# Patient Record
Sex: Female | Born: 1985 | Race: Black or African American | Hispanic: No | Marital: Single | Smoking: Current some day smoker
Health system: Southern US, Community
[De-identification: ages and names within clinical notes are randomized; demographics above are authoritative.]

## PROBLEM LIST (undated history)

## (undated) DIAGNOSIS — K859 Acute pancreatitis without necrosis or infection, unspecified: Secondary | ICD-10-CM

## (undated) DIAGNOSIS — R9431 Abnormal electrocardiogram [ECG] [EKG]: Secondary | ICD-10-CM

## (undated) DIAGNOSIS — G43909 Migraine, unspecified, not intractable, without status migrainosus: Secondary | ICD-10-CM

## (undated) DIAGNOSIS — K259 Gastric ulcer, unspecified as acute or chronic, without hemorrhage or perforation: Secondary | ICD-10-CM

## (undated) DIAGNOSIS — F419 Anxiety disorder, unspecified: Secondary | ICD-10-CM

## (undated) HISTORY — DX: Abnormal electrocardiogram (ECG) (EKG): R94.31

---
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
## 2005-03-27 ENCOUNTER — Emergency Department (HOSPITAL_COMMUNITY): Admission: EM | Admit: 2005-03-27 | Discharge: 2005-03-27 | Payer: Self-pay | Admitting: Emergency Medicine

## 2005-04-21 ENCOUNTER — Inpatient Hospital Stay (HOSPITAL_COMMUNITY): Admission: AD | Admit: 2005-04-21 | Discharge: 2005-04-21 | Payer: Self-pay | Admitting: Family Medicine

## 2006-06-09 ENCOUNTER — Emergency Department (HOSPITAL_COMMUNITY): Admission: EM | Admit: 2006-06-09 | Discharge: 2006-06-09 | Payer: Self-pay | Admitting: Emergency Medicine

## 2007-01-31 ENCOUNTER — Inpatient Hospital Stay (HOSPITAL_COMMUNITY): Admission: EM | Admit: 2007-01-31 | Discharge: 2007-02-05 | Payer: Self-pay | Admitting: Emergency Medicine

## 2007-06-07 ENCOUNTER — Emergency Department (HOSPITAL_COMMUNITY): Admission: EM | Admit: 2007-06-07 | Discharge: 2007-06-08 | Payer: Self-pay | Admitting: Emergency Medicine

## 2009-02-13 ENCOUNTER — Emergency Department (HOSPITAL_COMMUNITY): Admission: EM | Admit: 2009-02-13 | Discharge: 2009-02-13 | Payer: Self-pay | Admitting: Emergency Medicine

## 2009-03-20 ENCOUNTER — Ambulatory Visit: Payer: Self-pay | Admitting: Pulmonary Disease

## 2009-03-20 ENCOUNTER — Inpatient Hospital Stay (HOSPITAL_COMMUNITY): Admission: EM | Admit: 2009-03-20 | Discharge: 2009-03-22 | Payer: Self-pay | Admitting: Emergency Medicine

## 2009-03-28 ENCOUNTER — Ambulatory Visit: Payer: Self-pay | Admitting: Internal Medicine

## 2009-04-04 ENCOUNTER — Ambulatory Visit (HOSPITAL_COMMUNITY): Payer: Self-pay | Admitting: Psychiatry

## 2009-04-09 ENCOUNTER — Ambulatory Visit (HOSPITAL_COMMUNITY): Payer: Self-pay | Admitting: Psychiatry

## 2010-01-16 ENCOUNTER — Emergency Department (HOSPITAL_COMMUNITY): Admission: EM | Admit: 2010-01-16 | Discharge: 2010-01-16 | Payer: Self-pay | Admitting: Emergency Medicine

## 2010-03-18 ENCOUNTER — Ambulatory Visit (HOSPITAL_COMMUNITY)
Admission: RE | Admit: 2010-03-18 | Discharge: 2010-03-18 | Payer: Self-pay | Source: Home / Self Care | Attending: Obstetrics and Gynecology | Admitting: Obstetrics and Gynecology

## 2010-03-31 ENCOUNTER — Encounter: Admit: 2010-03-31 | Payer: Self-pay | Admitting: Obstetrics and Gynecology

## 2010-04-18 ENCOUNTER — Inpatient Hospital Stay (HOSPITAL_COMMUNITY)
Admission: AD | Admit: 2010-04-18 | Discharge: 2010-04-18 | Disposition: A | Discharge status: Home / Self Care | Payer: Medicaid Other | Source: Ambulatory Visit | Attending: Obstetrics and Gynecology | Admitting: Obstetrics and Gynecology

## 2010-04-18 DIAGNOSIS — O368190 Decreased fetal movements, unspecified trimester, not applicable or unspecified: Secondary | ICD-10-CM | POA: Insufficient documentation

## 2010-05-15 ENCOUNTER — Inpatient Hospital Stay (HOSPITAL_COMMUNITY)
Admission: AD | Admit: 2010-05-15 | Discharge: 2010-05-15 | Disposition: A | Discharge status: Home / Self Care | Payer: Medicaid Other | Source: Ambulatory Visit | Attending: Obstetrics & Gynecology | Admitting: Obstetrics & Gynecology

## 2010-05-15 DIAGNOSIS — J069 Acute upper respiratory infection, unspecified: Secondary | ICD-10-CM | POA: Insufficient documentation

## 2010-05-15 DIAGNOSIS — R109 Unspecified abdominal pain: Secondary | ICD-10-CM | POA: Insufficient documentation

## 2010-05-15 DIAGNOSIS — O99891 Other specified diseases and conditions complicating pregnancy: Secondary | ICD-10-CM | POA: Insufficient documentation

## 2010-05-15 LAB — URINALYSIS, ROUTINE W REFLEX MICROSCOPIC
Hgb urine dipstick: NEGATIVE
Ketones, ur: >80 mg/dL — AB
Nitrite: NEGATIVE
Protein, ur: NEGATIVE mg/dL
Specific Gravity, Urine: 1.02 (ref 1.005–1.030)
Urine Glucose, Fasting: NEGATIVE mg/dL
Urobilinogen, UA: 1 mg/dL (ref 0.0–1.0)
pH: 6 (ref 5.0–8.0)

## 2010-05-27 LAB — URINALYSIS, ROUTINE W REFLEX MICROSCOPIC
Bilirubin Urine: NEGATIVE
Glucose, UA: NEGATIVE mg/dL
Hgb urine dipstick: NEGATIVE
Ketones, ur: 40 mg/dL — AB
Nitrite: NEGATIVE
Protein, ur: NEGATIVE mg/dL
Specific Gravity, Urine: 1.018 (ref 1.005–1.030)
Urobilinogen, UA: 1 mg/dL (ref 0.0–1.0)
pH: 6.5 (ref 5.0–8.0)

## 2010-05-27 LAB — COMPREHENSIVE METABOLIC PANEL
ALT: 22 U/L (ref 0–35)
AST: 19 U/L (ref 0–37)
Albumin: 3.2 g/dL — ABNORMAL LOW (ref 3.5–5.2)
Alkaline Phosphatase: 45 U/L (ref 39–117)
BUN: 6 mg/dL (ref 6–23)
CO2: 23 mEq/L (ref 19–32)
Calcium: 9.4 mg/dL (ref 8.4–10.5)
Chloride: 103 mEq/L (ref 96–112)
Creatinine, Ser: 0.74 mg/dL (ref 0.4–1.2)
GFR calc Af Amer: >60 mL/min (ref >60)
GFR calc non Af Amer: >60 mL/min (ref >60)
Glucose, Bld: 85 mg/dL (ref 70–99)
Potassium: 3.8 mEq/L (ref 3.5–5.1)
Sodium: 135 mEq/L (ref 135–145)
Total Bilirubin: 0.7 mg/dL (ref 0.3–1.2)
Total Protein: 7.2 g/dL (ref 6.0–8.3)

## 2010-05-27 LAB — CBC
HCT: 32.7 % — ABNORMAL LOW (ref 36.0–46.0)
Hemoglobin: 11.1 g/dL — ABNORMAL LOW (ref 12.0–15.0)
MCH: 29.3 pg (ref 26.0–34.0)
MCHC: 34.1 g/dL (ref 30.0–36.0)
MCV: 86 fL (ref 78.0–100.0)
Platelets: 287 10*3/uL (ref 150–400)
RBC: 3.8 MIL/uL — ABNORMAL LOW (ref 3.87–5.11)
RDW: 14.6 % (ref 11.5–15.5)
WBC: 6.2 10*3/uL (ref 4.0–10.5)

## 2010-05-27 LAB — WET PREP, GENITAL
Trich, Wet Prep: NONE SEEN
Yeast Wet Prep HPF POC: NONE SEEN

## 2010-05-27 LAB — DIFFERENTIAL
Basophils Absolute: 0 10*3/uL (ref 0.0–0.1)
Basophils Relative: 1 % (ref 0–1)
Eosinophils Absolute: 0.2 10*3/uL (ref 0.0–0.7)
Eosinophils Relative: 4 % (ref 0–5)
Lymphocytes Relative: 25 % (ref 12–46)
Lymphs Abs: 1.5 10*3/uL (ref 0.7–4.0)
Monocytes Absolute: 0.6 10*3/uL (ref 0.1–1.0)
Monocytes Relative: 10 % (ref 3–12)
Neutro Abs: 3.7 10*3/uL (ref 1.7–7.7)
Neutrophils Relative %: 60 % (ref 43–77)

## 2010-05-27 LAB — PREGNANCY, URINE: Preg Test, Ur: POSITIVE

## 2010-05-27 LAB — GC/CHLAMYDIA PROBE AMP, GENITAL
Chlamydia, DNA Probe: NEGATIVE
GC Probe Amp, Genital: NEGATIVE

## 2010-05-27 LAB — LIPASE, BLOOD: Lipase: 19 U/L (ref 11–59)

## 2010-05-27 LAB — HCG, QUANTITATIVE, PREGNANCY: hCG, Beta Chain, Quant, S: 81833 m[IU]/mL — ABNORMAL HIGH (ref <5)

## 2010-06-01 LAB — CBC
HCT: 30.1 % — ABNORMAL LOW (ref 36.0–46.0)
Hemoglobin: 10 g/dL — ABNORMAL LOW (ref 12.0–15.0)
Hemoglobin: 10.1 g/dL — ABNORMAL LOW (ref 12.0–15.0)
Hemoglobin: 12.2 g/dL (ref 12.0–15.0)
MCHC: 33.6 g/dL (ref 30.0–36.0)
MCHC: 33.7 g/dL (ref 30.0–36.0)
MCHC: 34.4 g/dL (ref 30.0–36.0)
MCV: 87.8 fL (ref 78.0–100.0)
Platelets: 304 10*3/uL (ref 150–400)
RBC: 3.43 MIL/uL — ABNORMAL LOW (ref 3.87–5.11)
RDW: 14.9 % (ref 11.5–15.5)
RDW: 15.8 % — ABNORMAL HIGH (ref 11.5–15.5)

## 2010-06-01 LAB — POCT I-STAT 3, ART BLOOD GAS (G3+)
Acid-base deficit: 2 mmol/L (ref 0.0–2.0)
Bicarbonate: 22.2 mEq/L (ref 20.0–24.0)
O2 Saturation: 100 %
TCO2: 24 mmol/L (ref 0–100)
pCO2 arterial: 42.5 mmHg (ref 35.0–45.0)
pO2, Arterial: 189 mmHg — ABNORMAL HIGH (ref 80.0–100.0)
pO2, Arterial: 530 mmHg — ABNORMAL HIGH (ref 80.0–100.0)

## 2010-06-01 LAB — POCT I-STAT 3, VENOUS BLOOD GAS (G3P V)
Acid-base deficit: 2 mmol/L (ref 0.0–2.0)
Patient temperature: 37
pH, Ven: 7.404 — ABNORMAL HIGH (ref 7.250–7.300)
pO2, Ven: 54 mmHg — ABNORMAL HIGH (ref 30.0–45.0)

## 2010-06-01 LAB — DIFFERENTIAL
Basophils Absolute: 0 10*3/uL (ref 0.0–0.1)
Basophils Relative: 1 % (ref 0–1)
Lymphocytes Relative: 30 % (ref 12–46)
Monocytes Absolute: 0.6 10*3/uL (ref 0.1–1.0)
Neutro Abs: 4.4 10*3/uL (ref 1.7–7.7)

## 2010-06-01 LAB — GLUCOSE, CAPILLARY: Glucose-Capillary: 104 mg/dL — ABNORMAL HIGH (ref 70–99)

## 2010-06-01 LAB — BASIC METABOLIC PANEL
CO2: 25 mEq/L (ref 19–32)
CO2: 26 mEq/L (ref 19–32)
Calcium: 8.6 mg/dL (ref 8.4–10.5)
Chloride: 107 mEq/L (ref 96–112)
Creatinine, Ser: 0.79 mg/dL (ref 0.4–1.2)
GFR calc Af Amer: >60 mL/min (ref >60)
Glucose, Bld: 85 mg/dL (ref 70–99)
Potassium: 3.6 mEq/L (ref 3.5–5.1)
Sodium: 138 mEq/L (ref 135–145)

## 2010-06-01 LAB — POCT I-STAT, CHEM 8
Chloride: 108 mEq/L (ref 96–112)
Creatinine, Ser: 1 mg/dL (ref 0.4–1.2)
HCT: 38 % (ref 36.0–46.0)
Hemoglobin: 12.9 g/dL (ref 12.0–15.0)
Potassium: 3.2 mEq/L — ABNORMAL LOW (ref 3.5–5.1)
Sodium: 142 mEq/L (ref 135–145)

## 2010-06-01 LAB — COMPREHENSIVE METABOLIC PANEL
Albumin: 3.5 g/dL (ref 3.5–5.2)
Alkaline Phosphatase: 50 U/L (ref 39–117)
BUN: 6 mg/dL (ref 6–23)
Creatinine, Ser: 0.82 mg/dL (ref 0.4–1.2)
Potassium: 4.2 mEq/L (ref 3.5–5.1)
Total Protein: 7.1 g/dL (ref 6.0–8.3)

## 2010-06-01 LAB — URINE MICROSCOPIC-ADD ON

## 2010-06-01 LAB — URINALYSIS, ROUTINE W REFLEX MICROSCOPIC
Glucose, UA: NEGATIVE mg/dL
Specific Gravity, Urine: 1.014 (ref 1.005–1.030)
pH: 5.5 (ref 5.0–8.0)

## 2010-06-01 LAB — RAPID URINE DRUG SCREEN, HOSP PERFORMED
Barbiturates: NOT DETECTED
Opiates: NOT DETECTED

## 2010-06-01 LAB — URINE CULTURE

## 2010-06-01 LAB — ETHANOL: Alcohol, Ethyl (B): 145 mg/dL — ABNORMAL HIGH (ref 0–10)

## 2010-06-01 LAB — SALICYLATE LEVEL: Salicylate Lvl: <4 mg/dL (ref 2.8–20.0)

## 2010-07-29 NOTE — H&P (Signed)
NAMEREILLEY, Sara NO.:  [id number]   MEDICAL RECORD NO.:  [id number]          PATIENT TYPE:  EMS   LOCATION:  ED                           FACILITY:  [facility]   PHYSICIAN:  Herbie Saxon, MDDATE OF BIRTH:  February 09, 1986   DATE OF ADMISSION:  01/31/2007  DATE OF DISCHARGE:                              HISTORY & PHYSICAL   PRIMARY CARE PHYSICIAN:  Unassigned.   CHIEF COMPLAINT:  Cough for three days, shortness of breath and wheezing  for one day.   HISTORY OF PRESENT ILLNESS:  This is a 25 year old African-American  female, student of [employer] who has a past medical history of  childhood bronchial asthma.  She started coughing with greenish-yellow  phlegm about three days associated with increasing shortness of breath  overnight, with increased wheezing and dyspnea on mild exertion  associated with rib cage pain from profuse coughing.  She denies any  hemoptysis.  She has low grade fever.  No substernal chest pain.  She  denies any paroxysmal nocturnal dyspnea, no orthopnea, no pedal  swelling.  No palpitations, no headache.  She denies any diarrhea,  constipation, dysuria, or hematuria.  No joint pain or skin rash.  No  lightheadedness.  The patient has used prednisone for asthma in the  past, but never been intubated for severe asthma.   PAST MEDICAL HISTORY:  Bronchial asthma.   PAST SURGICAL HISTORY:  None of note.   FAMILY HISTORY:  Noncontributory.   SOCIAL HISTORY:  She is single and has no children.  She smokes cigars,  about five every three days.  Social drinker at parties.  No history of  drug abuse.   REVIEW OF SYSTEMS:  14 systems reviewed, pertinent positives as present  in the history of presenting illness.   PHYSICAL EXAMINATION:  GENERAL:  She is a young lady, obese, mildly  tachypneic.  VITAL SIGNS:  Temperature is 99.4, pulse 114, respiratory rate is 24.  HEENT:  Pupils equal, round, and reactive to light and  accommodation.  Head is normocephalic and atraumatic.  Mucous membranes are moist.  Oropharynx and nasopharynx are clear.  NECK:  Supple, there is no thyromegaly or carotid bruit.  No elevated  JVP.  LUNGS:  She has bilateral expiratory rhonchi.  HEART:  Sounds 1 and 2 tachycardic.  ABDOMEN:  Soft and nontender.  No organomegaly.  Bowel sounds are  normoactive.  Inguinal orifices are patent.  NEUROLOGY:  She is alert and oriented x3.  Cranial nerves II-XII grossly  intact.  Power is 5+ in all limbs tested.  Reflexes are 2+ globally.  Sensation is normal.  Peripheral pulses present, no pedal edema.   LABORATORY DATA:  Pending.   ASSESSMENT:  1. Acute on chronic bronchitis, chronic obstructive pulmonary disease,      bronchial asthma exacerbation.  2. Morbid obesity.  3. Tachycardia.   PLAN:  The patient is to be admitted to a medical bed.  IV fluid normal  saline at 40 mL an hour.  We will start on IV Solu-Medrol 80 mg q.8  hours, Zithromax 500 mg IV daily, magnesium sulfate  2 grams IV, start  Nicotine patch 20 mg per day, Xanax 0.5 mg p.o. b.i.d.  Counseled on  tobacco cessation.  Albuterol Atrovent one unit dose q.2-3 hours p.r.n.  Tylenol 650 mg q.4 hours p.r.n. for fever or mild pain.   MEDICATIONS:  Albuterol two puffs q.6 hours p.r.n.   ALLERGIES:  No known drug allergies.   DIET:  Regular.   We will review labs, chest x-ray, and EKG.      Herbie Saxon, MD  Electronically Signed     MIO/MEDQ  D:  01/31/2007  T:  01/31/2007  Job:  [id number]

## 2010-07-29 NOTE — Discharge Summary (Signed)
NAMESHANDORA, KOOGLER NO.:  [id number]   MEDICAL RECORD NO.:  [id number]          PATIENT TYPE:  INP   LOCATION:  1604                         FACILITY:  [facility]   PHYSICIAN:  Elliot Cousin, M.D.    DATE OF BIRTH:  1985/11/05   DATE OF ADMISSION:  01/31/2007  DATE OF DISCHARGE:  02/05/2007                               DISCHARGE SUMMARY   DISCHARGE DIAGNOSES:  1. Acute asthmatic bronchitis.  2. Hyperglycemia secondary to steroids.  3. Tobacco abuse.  4. Bradycardia.  5. THE PATIENT HAD AN ALLERGIC REACTION TO THE CONTRAST/ DYE.   DISCHARGE MEDICATIONS:  1. Albuterol MDI 2 puffs every 4 hours as needed.  2. Prednisone dose taper, to be tapered over the next 5 days.  3. Zithromax 250 mg daily for two more days.  4. Claritin 10 mg daily.  5. Robitussin DM 10 mL every 4 hours as needed.   DISCHARGE DISPOSITION:  The patient was discharged home in improved and  stable condition on February 05, 2007.  She was advised to follow up  with the [facility] in one to two weeks.   CONSULTATIONS:  None.   PROCEDURES PERFORMED:  1. VQ scan, February 01, 2007.  The results revealed negative VQ scan      for pulmonary embolism.  Air trapping which may be due to reactive      airways disease in this young patient.  2. CT angiogram of the chest on February 01, 2007.  The study is      suboptimal for diagnosing PE.  Unfortunately, the degree of      contrast opacification of the pulmonary arteries is suboptimal.      The patient also developed an allergic reaction to the contrast      media; therefore, additional contrast could not be administered.      No gross evidence of PE.  Consider VQ scan for further evaluation.  3. Chest x-ray on January 31, 2007.  The results revealed no acute      findings.   HISTORY OF PRESENT ILLNESS:  The patient is a 25 year old woman with  bronchial asthma, who presented to the emergency department on January 31, 2007 with a chief  complaint of shortness of breath, wheezing, chest  tightness, and coughing.  When the patient was evaluated in the  emergency department, she was noted to be afebrile with a temperature of  99.4, tachycardiac, and tachypneic.  She was given Solu-Medrol in the  emergency department and subsequently admitted for further evaluation  and management.   For additional details, please see the dictated history and physical.   HOSPITAL COURSE:  1. ACUTE ASTHMATIC BRONCHITIS.  The patient was started on albuterol      and Atrovent nebulizers every 4 hours and then every 2 hours p.r.n.      Two grams of magnesium sulfate were given.  She was started      empirically on antibiotics with Rocephin and azithromycin.  Steroid      therapy was continued with Solu-Medrol 80 mg q.8 h.  The patient  does smoke, and she was strongly admonished to stop.  She did      receive formal tobacco cessation counseling.  She was started on a      nicotine patch.  Xanax was started as needed for anxiousness.  The      patient was also given Mucinex and as-needed Robitussin DM.  For      further evaluation, a CT scan of the chest was ordered to rule out      PE.  The study was suboptimal, as the patient developed an allergic      reaction to the contrast media. She developed hives and pruritis.      She received Benadryl and additional Solu-Medrol.  Because of the      incomplete study, a VQ scan was ordered.  The VQ scan was negative      for PE. Her ABG on oxygen revealed a pH of 7.42, pCO2 of 35, and a      pO2 of 69.   Over the course of the hospitalization, the patient was started on  Claritin and then Singulair.  She developed a tingling sensation in her  legs, therefore, the Singulair was discontinued. It was unclear if the  Singulair caused her symptoms.   The patient's chest tightness subsided. Prior to hospital discharge, she  began to oxygenate 98% on room air.  The patient became less and less   symptomatic.  On the exam prior to hospital discharge, she was  completely free of bronchospasms.  The Solu-Medrol was tapered off, and  the patient was started on prednisone daily.  She will be discharged to  home on two more days of azithromycin and a 5-day prednisone dose taper.  Again, the patient was strongly admonished to stop smoking.  She was  advised to follow up with a physician at the [place] [facility].  1. HYPERGLYCEMIA.  The patient's capillary blood glucose increased      during the hospital course.  She had no prior history of diabetes      mellitus.  Sliding scale NovoLog and Lantus were administered.  Her      capillary blood glucose was well controlled.  As the prednisone is      tapered off, her serum glucose is expected to normalize.  As of      this morning, her capillary blood sugar was 102.  2. BRADYCARDIA.  The patient was initially tachycardiac at the time of      the initial hospital assessment.  However, as she improved      clinically, she did have asymptomatic bradycardia, particularly      when she was sleeping and at rest.  Her heart rate at rest ranged      from 50 to 60.  With activity, it increased to 80 to 90 beats per      minute.  3. ALLERGIC REACTION TO CONTRAST DYE.  As indicated above, the patient      developed hives and itching and mild worsening shortness of breath,      when she was given the contrast media for the CT of the chest.  She      was quickly treated with Benadryl and Solu-Medrol.  Her symptoms      quickly abated.  The patient was advised to always let the medical      establishment know that she is allergic to IV contrast.  The      patient voiced understanding.  Elliot Cousin, M.D.  Electronically Signed     DF/MEDQ  D:  02/05/2007  T:  02/06/2007  Job:  [id number]

## 2010-08-07 ENCOUNTER — Inpatient Hospital Stay (HOSPITAL_COMMUNITY): Payer: Medicaid Other

## 2010-08-07 ENCOUNTER — Encounter (HOSPITAL_COMMUNITY): Payer: Self-pay | Admitting: Radiology

## 2010-08-07 ENCOUNTER — Inpatient Hospital Stay (HOSPITAL_COMMUNITY)
Admission: AD | Admit: 2010-08-07 | Discharge: 2010-08-07 | Disposition: A | Discharge status: Home / Self Care | Payer: Medicaid Other | Source: Ambulatory Visit | Attending: Obstetrics and Gynecology | Admitting: Obstetrics and Gynecology

## 2010-08-07 DIAGNOSIS — O368190 Decreased fetal movements, unspecified trimester, not applicable or unspecified: Secondary | ICD-10-CM | POA: Insufficient documentation

## 2010-08-19 ENCOUNTER — Inpatient Hospital Stay (HOSPITAL_COMMUNITY)
Admission: AD | Admit: 2010-08-19 | Discharge: 2010-08-23 | DRG: 775 | Disposition: A | Discharge status: Home / Self Care | Payer: Medicaid Other | Source: Ambulatory Visit | Attending: Obstetrics and Gynecology | Admitting: Obstetrics and Gynecology

## 2010-08-19 ENCOUNTER — Encounter (HOSPITAL_COMMUNITY): Payer: Self-pay | Admitting: *Deleted

## 2010-08-19 DIAGNOSIS — O480 Post-term pregnancy: Principal | ICD-10-CM | POA: Diagnosis present

## 2010-08-19 LAB — CBC
MCH: 27.7 pg (ref 26.0–34.0)
Platelets: 314 10*3/uL (ref 150–400)
RBC: 3.93 MIL/uL (ref 3.87–5.11)
RDW: 15.3 % (ref 11.5–15.5)
WBC: 10.3 10*3/uL (ref 4.0–10.5)

## 2010-08-19 LAB — MRSA PCR SCREENING: MRSA by PCR: NEGATIVE

## 2010-08-20 LAB — RPR: RPR Ser Ql: NONREACTIVE

## 2010-08-22 LAB — CBC
Hemoglobin: 8.8 g/dL — ABNORMAL LOW (ref 12.0–15.0)
MCHC: 32.8 g/dL (ref 30.0–36.0)
RBC: 3.18 MIL/uL — ABNORMAL LOW (ref 3.87–5.11)

## 2010-12-08 LAB — BASIC METABOLIC PANEL
CO2: 26
Calcium: 8.9
GFR calc Af Amer: >60
GFR calc non Af Amer: >60
Sodium: 136

## 2010-12-08 LAB — DIFFERENTIAL
Lymphocytes Relative: 33
Lymphs Abs: 2.4
Monocytes Absolute: 0.6
Monocytes Relative: 9
Neutro Abs: 4.2

## 2010-12-08 LAB — CBC
Hemoglobin: 11.4 — ABNORMAL LOW
MCHC: 33.2
RBC: 4.09
WBC: 7.5

## 2010-12-23 LAB — CBC
Hemoglobin: 11.2 — ABNORMAL LOW
Hemoglobin: 11.7 — ABNORMAL LOW
MCHC: 33.4
MCHC: 34.1
RBC: 3.89
RBC: 4.14
WBC: 18.3 — ABNORMAL HIGH

## 2010-12-23 LAB — BASIC METABOLIC PANEL
BUN: 14
BUN: 8
CO2: 23
CO2: 24
Calcium: 9.2
Chloride: 105
Chloride: 106
Creatinine, Ser: 0.76
GFR calc Af Amer: >60
GFR calc non Af Amer: >60
Glucose, Bld: 120 — ABNORMAL HIGH
Glucose, Bld: 121 — ABNORMAL HIGH
Potassium: 3.4 — ABNORMAL LOW
Potassium: 4.1
Sodium: 134 — ABNORMAL LOW
Sodium: 136

## 2010-12-23 LAB — BLOOD GAS, ARTERIAL
Acid-base deficit: 0.9
FIO2: 0.28
O2 Content: 6
O2 Saturation: 94.1
Patient temperature: 98.6
pO2, Arterial: 68.9 — ABNORMAL LOW

## 2010-12-23 LAB — LIPID PANEL
Cholesterol: 115
HDL: 51
Triglycerides: 49

## 2010-12-23 LAB — CULTURE, BLOOD (ROUTINE X 2)

## 2010-12-23 LAB — MAGNESIUM: Magnesium: 1.6

## 2010-12-23 LAB — URINALYSIS, ROUTINE W REFLEX MICROSCOPIC
Glucose, UA: NEGATIVE
Nitrite: NEGATIVE
Protein, ur: NEGATIVE
pH: 5.5

## 2010-12-23 LAB — COMPREHENSIVE METABOLIC PANEL
ALT: 26
Alkaline Phosphatase: 53
CO2: 25
Calcium: 8.8
Chloride: 103
GFR calc non Af Amer: >60
Glucose, Bld: 126 — ABNORMAL HIGH
Sodium: 134 — ABNORMAL LOW
Total Bilirubin: 0.6

## 2010-12-23 LAB — CK TOTAL AND CKMB (NOT AT ARMC)
CK, MB: 1.8
CK, MB: 2.1
Relative Index: 0.6
Relative Index: 1.1
Total CK: 193 — ABNORMAL HIGH

## 2010-12-23 LAB — PROTIME-INR: INR: 1

## 2010-12-23 LAB — PHOSPHORUS: Phosphorus: 2.6

## 2010-12-23 LAB — APTT: aPTT: 31

## 2010-12-30 ENCOUNTER — Encounter (HOSPITAL_COMMUNITY): Payer: Self-pay | Admitting: *Deleted

## 2011-07-07 ENCOUNTER — Emergency Department (HOSPITAL_COMMUNITY)
Admission: EM | Admit: 2011-07-07 | Discharge: 2011-07-08 | Disposition: A | Discharge status: Home / Self Care | Payer: Medicaid Other | Attending: Emergency Medicine | Admitting: Emergency Medicine

## 2011-07-07 DIAGNOSIS — R1032 Left lower quadrant pain: Secondary | ICD-10-CM | POA: Insufficient documentation

## 2011-07-07 DIAGNOSIS — J45909 Unspecified asthma, uncomplicated: Secondary | ICD-10-CM | POA: Insufficient documentation

## 2011-07-07 DIAGNOSIS — N200 Calculus of kidney: Secondary | ICD-10-CM

## 2011-07-07 DIAGNOSIS — F17200 Nicotine dependence, unspecified, uncomplicated: Secondary | ICD-10-CM | POA: Insufficient documentation

## 2011-07-07 DIAGNOSIS — N83209 Unspecified ovarian cyst, unspecified side: Secondary | ICD-10-CM | POA: Insufficient documentation

## 2011-07-07 DIAGNOSIS — N949 Unspecified condition associated with female genital organs and menstrual cycle: Secondary | ICD-10-CM | POA: Insufficient documentation

## 2011-07-08 ENCOUNTER — Emergency Department (HOSPITAL_COMMUNITY): Payer: Medicaid Other

## 2011-07-08 ENCOUNTER — Encounter (HOSPITAL_COMMUNITY): Payer: Self-pay | Admitting: Emergency Medicine

## 2011-07-08 LAB — URINALYSIS, ROUTINE W REFLEX MICROSCOPIC
Ketones, ur: NEGATIVE mg/dL
Leukocytes, UA: NEGATIVE
Nitrite: NEGATIVE
Protein, ur: NEGATIVE mg/dL
Urobilinogen, UA: 1 mg/dL (ref 0.0–1.0)
pH: 6.5 (ref 5.0–8.0)

## 2011-07-08 LAB — POCT PREGNANCY, URINE: Preg Test, Ur: NEGATIVE

## 2011-07-08 MED ORDER — ONDANSETRON HCL 4 MG PO TABS: 4.0000 mg | ORAL_TABLET | Freq: Four times a day (QID) | ORAL | Status: AC

## 2011-07-08 MED ORDER — HYDROCODONE-ACETAMINOPHEN 5-325 MG PO TABS: 1.0000 | ORAL_TABLET | Freq: Four times a day (QID) | ORAL | Status: AC | PRN

## 2011-07-08 NOTE — ED Provider Notes (Addendum)
History     CSN: [id number]  Arrival date & time 07/07/11  2352   First MD Initiated Contact with Patient 07/08/11 [phone]      Chief Complaint  Patient presents with  . Abdominal Pain    (Consider location/radiation/quality/duration/timing/severity/associated sxs/prior treatment) HPI Comments: 4 days of left lower abdominal pain, and pressure that radiates to her rectal area.  She has a history of ovarian cysts and this is similar in nature.  She was taking over-the-counter nonsteroidals without relief.  She has not seen her primary care physician  The history is provided by the patient.    Past Medical History  Diagnosis Date  . Asthma     History reviewed. No pertinent past surgical history.  Family History  Problem Relation Age of Onset  . Hypertension Other   . Diabetes Other     History  Substance Use Topics  . Smoking status: Current Everyday Smoker    Types: Cigarettes  . Smokeless tobacco: Not on file  . Alcohol Use: Yes     occassional    OB History    Grav Para Term Preterm Abortions TAB SAB Ect Mult Living   1 1 1              Review of Systems  Constitutional: Negative for fever and chills.  Gastrointestinal: Positive for abdominal pain. Negative for nausea, diarrhea and constipation.  Genitourinary: Positive for pelvic pain. Negative for vaginal bleeding and vaginal discharge.  Skin: Negative for rash and wound.  Neurological: Negative.     Allergies  Contrast media  Home Medications  No current outpatient prescriptions on file.  BP 103/55  Pulse 70  Temp 99 F (37.2 C) (Oral)  Resp 18  SpO2 97%  LMP 06/15/2011  Physical Exam  Vitals reviewed. Constitutional: She is oriented to person, place, and time. She appears well-developed and well-nourished.  HENT:  Head: Normocephalic.  Eyes: Pupils are equal, round, and reactive to light.  Neck: Normal range of motion.  Cardiovascular: Normal rate.   Pulmonary/Chest: Effort normal.    Abdominal: Soft. She exhibits no distension. There is tenderness in the left lower quadrant.  Genitourinary: Pelvic exam was performed with patient supine. Left adnexum displays tenderness. No tenderness around the vagina. No vaginal discharge found.  Musculoskeletal: Normal range of motion.  Neurological: She is alert and oriented to person, place, and time.  Skin: Skin is warm. No rash noted.    ED Course  Procedures (including critical care time)  Labs Reviewed  WET PREP, GENITAL - Abnormal; Notable for the following:    Clue Cells Wet Prep HPF POC FEW (*)     WBC, Wet Prep HPF POC FEW (*)     All other components within normal limits  URINALYSIS, ROUTINE W REFLEX MICROSCOPIC  GC/CHLAMYDIA PROBE AMP, GENITAL  POCT PREGNANCY, URINE  LAB REPORT - SCANNED   No results found.   1. Kidney stone on left side       MDM  After pelvic examination, the patient has a left ovarian cyst, since the pain has been intermittent in nature for the past 4, days.  We'll ask for an ultrasound to rule out torsion Ultrasound of the abdomen is normal, revealing normal ovaries.  No endometrial thickening, acute abdomen was then obtained, which revealed a small left renal stone.  She will be referred to urology        Arman Filter, NP 07/08/11 4401  Arman Filter, NP 09/29/11 2129

## 2011-07-08 NOTE — ED Notes (Signed)
Pt states she is having abd pain that started about 4 days ago Pt states the pain is in her lower abdomen mainly on the left side describes as a pressure and states she also has a lot of pressure in her bottom

## 2011-07-08 NOTE — Discharge Instructions (Signed)
Kidney Stones Kidney stones (ureteral lithiasis) are solid masses that form inside your kidneys. The intense pain is caused by the stone moving through the kidney, ureter, bladder, and urethra (urinary tract). When the stone moves, the ureter starts to spasm around the stone. The stone is usually passed in the urine.  HOME CARE  Drink enough fluids to keep your pee (urine) clear or pale yellow. This helps to get the stone out.   Strain all pee through the provided strainer. Do not pee without peeing through the strainer, not even once. If you pee the stone out, catch it. The stone may be as small as a grain of salt. Take this to your doctor.   Only take medicine as told by your doctor.   Follow up with your doctor as told.   Get follow-up X-rays as told by your doctor.  GET HELP RIGHT AWAY IF:   Your pain does not get better with medicine.   You have a fever.   Your pain increases and gets worse over 18 hours.   You have new belly (abdominal) pain.   You feel faint or pass out.  MAKE SURE YOU:   Understand these instructions.   Will watch your condition.   Will get help right away if you are not doing well or get worse.  Document Released: 08/19/2007 Document Revised: 02/19/2011 Document Reviewed: 12/28/2008 Exit[employer] Patient Information 2012 [employer]. 

## 2011-07-08 NOTE — ED Notes (Signed)
Patient transported to US 

## 2011-07-08 NOTE — ED Notes (Signed)
Pt back from [facility]

## 2011-07-10 NOTE — ED Provider Notes (Signed)
Medical screening examination/treatment/procedure(s) were performed by non-physician practitioner and as supervising physician I was immediately available for consultation/collaboration. Devoria Albe, MD, FACEP   Ward Givens, MD 07/10/11 [phone]

## 2011-09-29 NOTE — ED Provider Notes (Signed)
Medical screening examination/treatment/procedure(s) were performed by non-physician practitioner and as supervising physician I was immediately available for consultation/collaboration. Aras Albarran, MD, FACEP   Zacharie Portner L Mariel Lukins, MD 09/29/11 2342 

## 2012-07-25 ENCOUNTER — Emergency Department: Payer: Self-pay | Admitting: Emergency Medicine

## 2012-07-25 LAB — URINALYSIS, COMPLETE
Bacteria: NONE SEEN
Blood: NEGATIVE
Ketone: NEGATIVE
Leukocyte Esterase: NEGATIVE
Nitrite: NEGATIVE
Ph: 6 (ref 4.5–8.0)
RBC,UR: NONE SEEN /HPF (ref 0–5)
Specific Gravity: 1.005 (ref 1.003–1.030)
Squamous Epithelial: NONE SEEN
WBC UR: NONE SEEN /HPF (ref 0–5)

## 2012-07-25 LAB — COMPREHENSIVE METABOLIC PANEL
Calcium, Total: 9.2 mg/dL (ref 8.5–10.1)
Chloride: 102 mmol/L (ref 98–107)
Co2: 28 mmol/L (ref 21–32)
EGFR (African American): >60
EGFR (Non-African Amer.): >60
Osmolality: 268 (ref 275–301)
SGOT(AST): 27 U/L (ref 15–37)
Total Protein: 8.2 g/dL (ref 6.4–8.2)

## 2012-07-25 LAB — PREGNANCY, URINE: Pregnancy Test, Urine: NEGATIVE m[IU]/mL

## 2012-07-25 LAB — CBC
HGB: 12.3 g/dL (ref 12.0–16.0)
MCH: 28.5 pg (ref 26.0–34.0)
Platelet: 320 10*3/uL (ref 150–440)
WBC: 8.6 10*3/uL (ref 3.6–11.0)

## 2012-10-09 ENCOUNTER — Emergency Department (HOSPITAL_COMMUNITY): Payer: 59

## 2012-10-09 ENCOUNTER — Emergency Department (HOSPITAL_COMMUNITY)
Admission: EM | Admit: 2012-10-09 | Discharge: 2012-10-09 | Disposition: A | Discharge status: Home / Self Care | Payer: 59 | Attending: Emergency Medicine | Admitting: Emergency Medicine

## 2012-10-09 ENCOUNTER — Encounter (HOSPITAL_COMMUNITY): Payer: Self-pay | Admitting: Emergency Medicine

## 2012-10-09 DIAGNOSIS — F17200 Nicotine dependence, unspecified, uncomplicated: Secondary | ICD-10-CM | POA: Insufficient documentation

## 2012-10-09 DIAGNOSIS — S93409A Sprain of unspecified ligament of unspecified ankle, initial encounter: Secondary | ICD-10-CM | POA: Insufficient documentation

## 2012-10-09 DIAGNOSIS — Y929 Unspecified place or not applicable: Secondary | ICD-10-CM | POA: Insufficient documentation

## 2012-10-09 DIAGNOSIS — J45909 Unspecified asthma, uncomplicated: Secondary | ICD-10-CM | POA: Insufficient documentation

## 2012-10-09 DIAGNOSIS — W108XXA Fall (on) (from) other stairs and steps, initial encounter: Secondary | ICD-10-CM | POA: Insufficient documentation

## 2012-10-09 DIAGNOSIS — Y9389 Activity, other specified: Secondary | ICD-10-CM | POA: Insufficient documentation

## 2012-10-09 DIAGNOSIS — S93401A Sprain of unspecified ligament of right ankle, initial encounter: Secondary | ICD-10-CM

## 2012-10-09 DIAGNOSIS — X500XXA Overexertion from strenuous movement or load, initial encounter: Secondary | ICD-10-CM | POA: Insufficient documentation

## 2012-10-09 MED ORDER — NAPROXEN 500 MG PO TABS: 500.0000 mg | ORAL_TABLET | Freq: Two times a day (BID) | ORAL | Status: DC

## 2012-10-09 MED ORDER — HYDROCODONE-ACETAMINOPHEN 5-325 MG PO TABS: 1.0000 | ORAL_TABLET | Freq: Four times a day (QID) | ORAL | Status: DC | PRN

## 2012-10-09 NOTE — ED Provider Notes (Signed)
CSN: [id number]     Arrival date & time 10/09/12  4098 History     First MD Initiated Contact with Patient 10/09/12 [phone]     Chief Complaint  Patient presents with  . Fall  . Ankle Pain   (Consider location/radiation/quality/duration/timing/severity/associated sxs/prior Treatment) HPI Comments: Patient presents with pain of her right ankle.  She reports that approximately three hours prior to arrival she tripped going down the stairs and twisted her ankle.  She is currently having pain over the lateral malleolus of the right ankle.  She denies any other pain.  She has not taken anything for pain prior to arrival.  She has also noticed some swelling of the ankle.  She has been able to bear some weight, but reports significant pain with weight bearing.  Pain also worse with ROM of the ankle.  She denies numbness or tingling.  Denies prior injury to the ankle.    Patient is a 27 y.o. female presenting with ankle pain. The history is provided by the patient.  Ankle Pain   Past Medical History  Diagnosis Date  . Asthma    History reviewed. No pertinent past surgical history. Family History  Problem Relation Age of Onset  . Hypertension Other   . Diabetes Other    History  Substance Use Topics  . Smoking status: Current Every Day Smoker    Types: Cigarettes  . Smokeless tobacco: Not on file  . Alcohol Use: Yes     Comment: occassional   OB History   Grav Para Term Preterm Abortions TAB SAB Ect Mult Living   1 1 1             Review of Systems  Musculoskeletal:       Right ankle pain  All other systems reviewed and are negative.    Allergies  Contrast media  Home Medications  No current outpatient prescriptions on file. BP 124/60  Temp(Src) 99 F (37.2 C) (Oral)  Resp 20  SpO2 99%  LMP 09/25/2012 Physical Exam  Nursing note and vitals reviewed. Constitutional: She appears well-developed and well-nourished.  HENT:  Head: Normocephalic and atraumatic.  Neck:  Normal range of motion. Neck supple.  Cardiovascular: Normal rate, regular rhythm and normal heart sounds.   Pulses:      Dorsalis pedis pulses are 2+ on the right side, and 2+ on the left side.  Pulmonary/Chest: Effort normal and breath sounds normal.  Musculoskeletal:       Right knee: She exhibits normal range of motion and no swelling. No tenderness found.       Right ankle: She exhibits decreased range of motion and swelling. She exhibits no deformity, no laceration and normal pulse. Tenderness. Lateral malleolus tenderness found.  Neurological: She is alert. No sensory deficit.  Skin: Skin is warm and dry.  Psychiatric: She has a normal mood and affect.    ED Course   Procedures (including critical care time)  Labs Reviewed - No data to display Dg Ankle Complete Right  10/09/2012   *RADIOLOGY REPORT*  Clinical Data: Fall  RIGHT ANKLE - COMPLETE 3+ VIEW  Comparison: None.  Findings: No acute fracture and no dislocation.  IMPRESSION: No acute bony pathology.   Original Report Authenticated By: Jolaine Click, M.D.   No diagnosis found.  MDM  Patient presenting with right ankle pain after twisting it this morning.  Xray negative.  Neurovascularly intact.  Patient given Ankle ASO and crutches.  Patient stable for discharge.  Herbert Seta  Anne Shutter, PA-C 10/09/12 [phone]

## 2012-10-09 NOTE — ED Notes (Signed)
ASO  Applied to R ankle, pt given and instructed on use of crutches.  Discharge instructions explained to pt and a copy of instructions were supplied to pt; pt verbalized understanding of instructions.

## 2012-10-09 NOTE — ED Notes (Signed)
Pt tripped on the steps at 0600 this am and has swelling and pain to rt ankle. No loc, alert x4,

## 2012-10-12 NOTE — ED Provider Notes (Signed)
Medical screening examination/treatment/procedure(s) were performed by non-physician practitioner and as supervising physician I was immediately available for consultation/collaboration.  Candyce Churn, MD 10/12/12 1215

## 2013-05-06 ENCOUNTER — Other Ambulatory Visit (HOSPITAL_COMMUNITY)
Admission: RE | Admit: 2013-05-06 | Discharge: 2013-05-06 | Disposition: A | Discharge status: Home / Self Care | Payer: BC Managed Care – PPO | Source: Ambulatory Visit | Attending: Emergency Medicine | Admitting: Emergency Medicine

## 2013-05-06 ENCOUNTER — Encounter (HOSPITAL_COMMUNITY): Payer: Self-pay | Admitting: Emergency Medicine

## 2013-05-06 ENCOUNTER — Emergency Department (HOSPITAL_COMMUNITY)
Admission: EM | Admit: 2013-05-06 | Discharge: 2013-05-06 | Disposition: A | Discharge status: Home / Self Care | Payer: BC Managed Care – PPO | Source: Home / Self Care | Attending: Emergency Medicine | Admitting: Emergency Medicine

## 2013-05-06 DIAGNOSIS — Z113 Encounter for screening for infections with a predominantly sexual mode of transmission: Secondary | ICD-10-CM | POA: Insufficient documentation

## 2013-05-06 DIAGNOSIS — N760 Acute vaginitis: Secondary | ICD-10-CM

## 2013-05-06 HISTORY — DX: Morbid (severe) obesity due to excess calories: E66.01

## 2013-05-06 HISTORY — DX: Gastric ulcer, unspecified as acute or chronic, without hemorrhage or perforation: K25.9

## 2013-05-06 LAB — POCT URINALYSIS DIP (DEVICE)
Bilirubin Urine: NEGATIVE
Glucose, UA: NEGATIVE mg/dL
HGB URINE DIPSTICK: NEGATIVE
Ketones, ur: NEGATIVE mg/dL
NITRITE: NEGATIVE
PH: 7 (ref 5.0–8.0)
PROTEIN: NEGATIVE mg/dL
Specific Gravity, Urine: 1.02 (ref 1.005–1.030)
UROBILINOGEN UA: 0.2 mg/dL (ref 0.0–1.0)

## 2013-05-06 MED ORDER — FLUCONAZOLE 150 MG PO TABS: 150.0000 mg | ORAL_TABLET | Freq: Once | ORAL | Status: DC

## 2013-05-06 NOTE — ED Provider Notes (Signed)
CSN: [id number]     Arrival date & time 05/06/13  0913 History   First MD Initiated Contact with Patient 05/06/13 [phone]     Chief Complaint  Patient presents with  . Vaginitis     (Consider location/radiation/quality/duration/timing/severity/associated sxs/prior Treatment) HPI Comments: 28 year old female presents complaining of vaginal irritation and discharge. This started 2 days ago. She recently finished taking Pylera in preparation for bariatric surgery and she believes this may have caused a yeast infection. She has had a yeast infection in the past with identical symptoms. She denies any risk of STDs, she is sexually active with women only. She denies any abdominal pain, dysuria.   Past Medical History  Diagnosis Date  . Asthma   . Morbid obesity   . Gastric ulcer    History reviewed. No pertinent past surgical history. Family History  Problem Relation Age of Onset  . Hypertension Other   . Diabetes Other    History  Substance Use Topics  . Smoking status: Current Every Day Smoker    Types: Cigarettes  . Smokeless tobacco: Not on file     Comment: In process of quitting smoking; smokes 2 cigarettes per day  . Alcohol Use: Yes     Comment: occassional   OB History   Grav Para Term Preterm Abortions TAB SAB Ect Mult Living   1 1 1             Review of Systems  Constitutional: Negative for fever and chills.  Eyes: Negative for visual disturbance.  Respiratory: Negative for cough and shortness of breath.   Cardiovascular: Negative for chest pain, palpitations and leg swelling.  Gastrointestinal: Negative for nausea, vomiting and abdominal pain.  Endocrine: Negative for polydipsia and polyuria.  Genitourinary: Positive for vaginal discharge (and irritation). Negative for dysuria, urgency, frequency, flank pain and genital sores.  Musculoskeletal: Negative for arthralgias and myalgias.  Skin: Negative for rash.  Neurological: Negative for dizziness, weakness and  light-headedness.      Allergies  Contrast media  Home Medications   Current Outpatient Rx  Name  Route  Sig  Dispense  Refill  . bismuth-metronidazole-tetracycline (PYLERA) 140-125-125 MG per capsule   Oral   Take 3 capsules by mouth 4 (four) times daily -  before meals and at bedtime.         . fluconazole (DIFLUCAN) 150 MG tablet   Oral   Take 1 tablet (150 mg total) by mouth once. Pick up the refill and and take second dose in 5 days if symptoms have not resolved   1 tablet   1   . HYDROcodone-acetaminophen (NORCO/VICODIN) 5-325 MG per tablet   Oral   Take 1-2 tablets by mouth every 6 (six) hours as needed for pain.   10 tablet   0   . naproxen (NAPROSYN) 500 MG tablet   Oral   Take 1 tablet (500 mg total) by mouth 2 (two) times daily.   30 tablet   0    BP 126/85  Pulse 60  Temp(Src) 99 F (37.2 C) (Oral)  Resp 16  SpO2 99%  LMP 04/17/2013 Physical Exam  Nursing note and vitals reviewed. Constitutional: She is oriented to person, place, and time. Vital signs are normal. She appears well-developed and well-nourished. No distress.  Morbid obesity  HENT:  Head: Normocephalic and atraumatic.  Pulmonary/Chest: Effort normal. No respiratory distress.  Abdominal: Soft. She exhibits no distension.  Genitourinary: There is no lesion on the right labia. There is no  lesion on the left labia. Cervix exhibits no discharge. No erythema, tenderness or bleeding around the vagina. Vaginal discharge (clumpy white) found.  Neurological: She is alert and oriented to person, place, and time. She has normal strength. Coordination normal.  Skin: Skin is warm and dry. No rash noted. She is not diaphoretic.  Psychiatric: She has a normal mood and affect. Judgment normal.    ED Course  Procedures (including critical care time) Labs Review Labs Reviewed  POCT URINALYSIS DIP (DEVICE) - Abnormal; Notable for the following:    Leukocytes, UA SMALL (*)    All other components  within normal limits  CERVICOVAGINAL ANCILLARY ONLY   Imaging Review No results found.    MDM   Final diagnoses:  Vaginitis    This exam is consistent with vulvovaginal candidiasis. Will treat with Diflucan empirically while awaiting test results. Followup when necessary   Meds ordered this encounter  Medications  . fluconazole (DIFLUCAN) 150 MG tablet    Sig: Take 1 tablet (150 mg total) by mouth once. Pick up the refill and and take second dose in 5 days if symptoms have not resolved    Dispense:  1 tablet    Refill:  1    Order Specific Question:  Supervising Provider    Answer:  Lorenz CoasterKELLER, DAVID C [[id number]]       Graylon GoodZachary H Quenisha Lovins, PA-C 05/06/13 1019

## 2013-05-06 NOTE — Discharge Instructions (Signed)
Vaginitis Vaginitis is an inflammation of the vagina. It is most often caused by a change in the normal balance of the bacteria and yeast that live in the vagina. This change in balance causes an overgrowth of certain bacteria or yeast, which causes the inflammation. There are different types of vaginitis, but the most common types are:  Bacterial vaginosis.  Yeast infection (candidiasis).  Trichomoniasis vaginitis. This is a sexually transmitted infection (STI).  Viral vaginitis.  Atropic vaginitis.  Allergic vaginitis. CAUSES  The cause depends on the type of vaginitis. Vaginitis can be caused by:  Bacteria (bacterial vaginosis).  Yeast (yeast infection).  A parasite (trichomoniasis vaginitis)  A virus (viral vaginitis).  Low hormone levels (atrophic vaginitis). Low hormone levels can occur during pregnancy, breastfeeding, or after menopause.  Irritants, such as bubble baths, scented tampons, and feminine sprays (allergic vaginitis). Other factors can change the normal balance of the yeast and bacteria that live in the vagina. These include:  Antibiotic medicines.  Poor hygiene.  Diaphragms, vaginal sponges, spermicides, birth control pills, and intrauterine devices (IUD).  Sexual intercourse.  Infection.  Uncontrolled diabetes.  A weakened immune system. SYMPTOMS  Symptoms can vary depending on the cause of the vaginitis. Common symptoms include:  Abnormal vaginal discharge.  The discharge is white, gray, or yellow with bacterial vaginosis.  The discharge is thick, white, and cheesy with a yeast infection.  The discharge is frothy and yellow or greenish with trichomoniasis.  A bad vaginal odor.  The odor is fishy with bacterial vaginosis.  Vaginal itching, pain, or swelling.  Painful intercourse.  Pain or burning when urinating. Sometimes, there are no symptoms. TREATMENT  Treatment will vary depending on the type of infection.   Bacterial  vaginosis and trichomoniasis are often treated with antibiotic creams or pills.  Yeast infections are often treated with antifungal medicines, such as vaginal creams or suppositories.  Viral vaginitis has no cure, but symptoms can be treated with medicines that relieve discomfort. Your sexual partner should be treated as well.  Atrophic vaginitis may be treated with an estrogen cream, pill, suppository, or vaginal ring. If vaginal dryness occurs, lubricants and moisturizing creams may help. You may be told to avoid scented soaps, sprays, or douches.  Allergic vaginitis treatment involves quitting the use of the product that is causing the problem. Vaginal creams can be used to treat the symptoms. HOME CARE INSTRUCTIONS   Take all medicines as directed by your caregiver.  Keep your genital area clean and dry. Avoid soap and only rinse the area with water.  Avoid douching. It can remove the healthy bacteria in the vagina.  Do not use tampons or have sexual intercourse until your vaginitis has been treated. Use sanitary pads while you have vaginitis.  Wipe from front to back. This avoids the spread of bacteria from the rectum to the vagina.  Let air reach your genital area.  Wear cotton underwear to decrease moisture buildup.  Avoid wearing underwear while you sleep until your vaginitis is gone.  Avoid tight pants and underwear or nylons without a cotton panel.  Take off wet clothing (especially bathing suits) as soon as possible.  Use mild, non-scented products. Avoid using irritants, such as:  Scented feminine sprays.  Fabric softeners.  Scented detergents.  Scented tampons.  Scented soaps or bubble baths.  Practice safe sex and use condoms. Condoms may prevent the spread of trichomoniasis and viral vaginitis. SEEK MEDICAL CARE IF:   You have abdominal pain.  You   have a fever or persistent symptoms for more than 2 3 days.  You have a fever and your symptoms suddenly  get worse. Document Released: 12/28/2006 Document Revised: 11/25/2011 Document Reviewed: 08/13/2011 Exit[employer] Patient Information 2014 [employer].  

## 2013-05-06 NOTE — ED Notes (Signed)
C/O vaginal itching x 2 days with progressive worsening.  Is finishing Pylera today in prep for bariatric surgery soon.  Has tried Monistat-1 and anti-itch cream without any relief.  Pt is sexually active homosexual.

## 2013-05-07 NOTE — ED Provider Notes (Signed)
Medical screening examination/treatment/procedure(s) were performed by non-physician practitioner and as supervising physician I was immediately available for consultation/collaboration.  Leslee Homeavid Trevonn Hallum, M.D.  Reuben Likesavid C Hanan Moen, MD 05/07/13 [phone]

## 2013-05-08 LAB — CERVICOVAGINAL ANCILLARY ONLY
Chlamydia: NEGATIVE
Neisseria Gonorrhea: NEGATIVE
WET PREP (BD AFFIRM): NEGATIVE
Wet Prep (BD Affirm): NEGATIVE
Wet Prep (BD Affirm): NEGATIVE

## 2013-09-01 ENCOUNTER — Encounter: Payer: Self-pay | Admitting: Cardiovascular Disease

## 2013-09-01 ENCOUNTER — Ambulatory Visit (INDEPENDENT_AMBULATORY_CARE_PROVIDER_SITE_OTHER): Payer: BC Managed Care – PPO | Admitting: Cardiovascular Disease

## 2013-09-01 DIAGNOSIS — R9431 Abnormal electrocardiogram [ECG] [EKG]: Secondary | ICD-10-CM

## 2013-09-01 NOTE — Assessment & Plan Note (Signed)
Anticipating bariatric surgery

## 2013-09-01 NOTE — Assessment & Plan Note (Signed)
Patient was sent for preoperative cardiac clearance prior to bariatric surgery because of an abnormal EKG which was not provided. Her EKG today in the office was completely normal. Given her young age lack of risk factors she'll be cleared at low-risk for her upcoming gastric sleeve /bariatric surgery.

## 2013-09-01 NOTE — Progress Notes (Signed)
09/01/2013 Sara Contreras   09/02/1985  [id number]  Primary Physician Sara FickAMACHANDRAN,AJITH, MD Primary Cardiologist: Runell GessJonathan J. Berry MD Sara RenoFACP,FACC,FAHA, FSCAI   HPI:  [name] is a playful 28 year old morbidly overweight single African American female referred for cardiovascular clearance prior to elective bariatric surgery. She works at [employer]. Her primary care physician is Dr. Nicholos Contreras. Her cardiac risk factor profile is minimal and notable for only occasional tobacco abuse. She denies chest pain or shortness of breath. She has never had a heart attack or stroke. Village he had an abnormal EKG we'll limping evaluated for her upcoming surgery. Today in the office her EKG is completely normal. She is asymptomatic and specifically denies chest pain or shortness of breath.   Current Outpatient Prescriptions  Medication Sig Dispense Refill  . bismuth-metronidazole-tetracycline (PYLERA) 140-125-125 MG per capsule Take 3 capsules by mouth 4 (four) times daily -  before meals and at bedtime.      . fluconazole (DIFLUCAN) 150 MG tablet Take 1 tablet (150 mg total) by mouth once. Pick up the refill and and take second dose in 5 days if symptoms have not resolved  1 tablet  1  . HYDROcodone-acetaminophen (NORCO/VICODIN) 5-325 MG per tablet Take 1-2 tablets by mouth every 6 (six) hours as needed for pain.  10 tablet  0  . naproxen (NAPROSYN) 500 MG tablet Take 1 tablet (500 mg total) by mouth 2 (two) times daily.  30 tablet  0   No current facility-administered medications for this visit.    Allergies  Allergen Reactions  . Contrast Media [Iodinated Diagnostic Agents] Hives  . Methylene Blue     History   Social History  . Marital Status: Single    Spouse Name: N/A    Number of Children: N/A  . Years of Education: N/A   Occupational History  . Not on file.   Social History Main Topics  . Smoking status: Current Some Day Smoker    Types: Cigarettes  . Smokeless tobacco:  Not on file     Comment: In process of quitting smoking; smokes 2 cigarettes per day  . Alcohol Use: Yes     Comment: occassional  . Drug Use: No  . Sexual Activity: Yes     Comment: Pt is homosexual   Other Topics Concern  . Not on file   Social History Narrative  . No narrative on file     Review of Systems: General: negative for chills, fever, night sweats or weight changes.  Cardiovascular: negative for chest pain, dyspnea on exertion, edema, orthopnea, palpitations, paroxysmal nocturnal dyspnea or shortness of breath Dermatological: negative for rash Respiratory: negative for cough or wheezing Urologic: negative for hematuria Abdominal: negative for nausea, vomiting, diarrhea, bright red blood per rectum, melena, or hematemesis Neurologic: negative for visual changes, syncope, or dizziness All other systems reviewed and are otherwise negative except as noted above.    Blood pressure 124/78, pulse 83, height 5' 7.5" (1.715 m), weight 380 lb (172.367 kg).  General appearance: alert and no distress Neck: no adenopathy, no carotid bruit, no JVD, supple, symmetrical, trachea midline and thyroid not enlarged, symmetric, no tenderness/mass/nodules Lungs: clear to auscultation bilaterally Heart: regular rate and rhythm, S1, S2 normal, no murmur, click, rub or gallop Extremities: extremities normal, atraumatic, no cyanosis or edema  EKG normal sinus rhythm at 83 with no ST or T wave changes  ASSESSMENT AND PLAN:   Morbid obesity Anticipating bariatric surgery  Abnormal EKG Patient was sent  for preoperative cardiac clearance prior to bariatric surgery because of an abnormal EKG which was not provided. Her EKG today in the office was completely normal. Given her young age lack of risk factors she'll be cleared at low-risk for her upcoming gastric sleeve /bariatric surgery.      Runell GessJonathan J. Berry MD FACP,FACC,FAHA, [facility] 09/01/2013 4:03 PM

## 2013-09-01 NOTE — Patient Instructions (Signed)
Your physician recommends that you schedule a follow-up appointment in: As Needed  Your have been cleared for Surgery by your physician

## 2014-01-06 ENCOUNTER — Emergency Department (HOSPITAL_COMMUNITY)
Admission: EM | Admit: 2014-01-06 | Discharge: 2014-01-06 | Disposition: A | Discharge status: Home / Self Care | Payer: BC Managed Care – PPO | Attending: Emergency Medicine | Admitting: Emergency Medicine

## 2014-01-06 ENCOUNTER — Encounter (HOSPITAL_COMMUNITY): Payer: Self-pay | Admitting: Emergency Medicine

## 2014-01-06 ENCOUNTER — Emergency Department (HOSPITAL_COMMUNITY): Payer: BC Managed Care – PPO

## 2014-01-06 DIAGNOSIS — J45901 Unspecified asthma with (acute) exacerbation: Secondary | ICD-10-CM | POA: Insufficient documentation

## 2014-01-06 DIAGNOSIS — Z720 Tobacco use: Secondary | ICD-10-CM | POA: Diagnosis not present

## 2014-01-06 DIAGNOSIS — R0602 Shortness of breath: Secondary | ICD-10-CM

## 2014-01-06 DIAGNOSIS — Z792 Long term (current) use of antibiotics: Secondary | ICD-10-CM | POA: Diagnosis not present

## 2014-01-06 DIAGNOSIS — J189 Pneumonia, unspecified organism: Secondary | ICD-10-CM

## 2014-01-06 DIAGNOSIS — R202 Paresthesia of skin: Secondary | ICD-10-CM | POA: Diagnosis not present

## 2014-01-06 DIAGNOSIS — J159 Unspecified bacterial pneumonia: Secondary | ICD-10-CM | POA: Diagnosis not present

## 2014-01-06 DIAGNOSIS — Z79899 Other long term (current) drug therapy: Secondary | ICD-10-CM | POA: Insufficient documentation

## 2014-01-06 DIAGNOSIS — Z8719 Personal history of other diseases of the digestive system: Secondary | ICD-10-CM | POA: Diagnosis not present

## 2014-01-06 LAB — CBC WITH DIFFERENTIAL/PLATELET
Basophils Absolute: 0 10*3/uL (ref 0.0–0.1)
Basophils Relative: 0 % (ref 0–1)
EOS PCT: 1 % (ref 0–5)
Eosinophils Absolute: 0.1 10*3/uL (ref 0.0–0.7)
HCT: 34 % — ABNORMAL LOW (ref 36.0–46.0)
Hemoglobin: 11.4 g/dL — ABNORMAL LOW (ref 12.0–15.0)
LYMPHS PCT: 13 % (ref 12–46)
Lymphs Abs: 1.5 10*3/uL (ref 0.7–4.0)
MCH: 28.6 pg (ref 26.0–34.0)
MCHC: 33.5 g/dL (ref 30.0–36.0)
MCV: 85.2 fL (ref 78.0–100.0)
MONO ABS: 0.7 10*3/uL (ref 0.1–1.0)
MONOS PCT: 6 % (ref 3–12)
NEUTROS ABS: 9.3 10*3/uL — AB (ref 1.7–7.7)
Neutrophils Relative %: 80 % — ABNORMAL HIGH (ref 43–77)
Platelets: 296 10*3/uL (ref 150–400)
RBC: 3.99 MIL/uL (ref 3.87–5.11)
RDW: 14.2 % (ref 11.5–15.5)
WBC: 11.6 10*3/uL — ABNORMAL HIGH (ref 4.0–10.5)

## 2014-01-06 LAB — BASIC METABOLIC PANEL
Anion gap: 18 — ABNORMAL HIGH (ref 5–15)
BUN: 8 mg/dL (ref 6–23)
CO2: 20 mEq/L (ref 19–32)
Calcium: 9.2 mg/dL (ref 8.4–10.5)
Chloride: 100 mEq/L (ref 96–112)
Creatinine, Ser: 0.86 mg/dL (ref 0.50–1.10)
Glucose, Bld: 106 mg/dL — ABNORMAL HIGH (ref 70–99)
POTASSIUM: 3.5 meq/L — AB (ref 3.7–5.3)
Sodium: 138 mEq/L (ref 137–147)

## 2014-01-06 LAB — D-DIMER, QUANTITATIVE: D-Dimer, Quant: 0.44 ug/mL-FEU (ref 0.00–0.48)

## 2014-01-06 MED ORDER — HYDROCODONE-ACETAMINOPHEN 5-325 MG PO TABS
1.0000 | ORAL_TABLET | ORAL | Status: AC
  Administered 2014-01-06: 1 via ORAL
  Filled 2014-01-06: qty 1

## 2014-01-06 MED ORDER — AZITHROMYCIN 250 MG PO TABS: 250.0000 mg | ORAL_TABLET | Freq: Every day | ORAL | Status: DC

## 2014-01-06 MED ORDER — PREDNISONE 50 MG PO TABS: 50.0000 mg | ORAL_TABLET | Freq: Every day | ORAL | Status: DC

## 2014-01-06 MED ORDER — IPRATROPIUM-ALBUTEROL 0.5-2.5 (3) MG/3ML IN SOLN
3.0000 mL | Freq: Once | RESPIRATORY_TRACT | Status: AC
  Administered 2014-01-06: 3 mL via RESPIRATORY_TRACT

## 2014-01-06 MED ORDER — PREDNISONE 20 MG PO TABS
60.0000 mg | ORAL_TABLET | Freq: Once | ORAL | Status: AC
  Administered 2014-01-06: 60 mg via ORAL
  Filled 2014-01-06: qty 3

## 2014-01-06 MED ORDER — HYDROCODONE-ACETAMINOPHEN 5-325 MG PO TABS: 1.0000 | ORAL_TABLET | ORAL | Status: DC | PRN

## 2014-01-06 MED ORDER — ALBUTEROL SULFATE (2.5 MG/3ML) 0.083% IN NEBU
2.5000 mg | INHALATION_SOLUTION | Freq: Once | RESPIRATORY_TRACT | Status: AC
  Administered 2014-01-06: 2.5 mg via RESPIRATORY_TRACT
  Filled 2014-01-06: qty 3

## 2014-01-06 NOTE — ED Provider Notes (Signed)
CSN: [id number]     Arrival date & time 01/06/14  [id number] History   First MD Initiated Contact with Patient 01/06/14 [phone]     Chief Complaint  Patient presents with  . Shortness of Breath    Patient is a 28 y.o. female presenting with shortness of breath. The history is provided by the patient.  Shortness of Breath Severity:  Severe Onset quality:  Gradual Duration:  1 day Timing:  Constant Progression:  Worsening Associated symptoms: chest pain, cough, fever (101), vomiting and wheezing   Associated symptoms: no sore throat and no sputum production   She tried her albuterol inhaler and nebulizer treatments but it has not helped. No hx of PE.  [place].  Past Medical History  Diagnosis Date  . Asthma   . Morbid obesity   . Gastric ulcer   . Abnormal EKG    History reviewed. No pertinent past surgical history. Family History  Problem Relation Age of Onset  . Hypertension Other   . Diabetes Other    History  Substance Use Topics  . Smoking status: Current Some Day Smoker    Types: Cigarettes  . Smokeless tobacco: Not on file     Comment: In process of quitting smoking; smokes 2 cigarettes per day  . Alcohol Use: Yes     Comment: occassional   OB History   Grav Para Term Preterm Abortions TAB SAB Ect Mult Living   1 1 1             Review of Systems  Constitutional: Positive for fever (101).  HENT: Negative for sore throat.   Respiratory: Positive for cough, shortness of breath and wheezing. Negative for sputum production.   Cardiovascular: Positive for chest pain.  Gastrointestinal: Positive for vomiting.  Neurological:       She is feeling tingling in her hands and head      Allergies  Contrast media  Home Medications   Prior to Admission medications   Medication Sig Start Date End Date Taking? Authorizing Provider  albuterol (PROVENTIL HFA;VENTOLIN HFA) 108 (90 BASE) MCG/ACT inhaler Inhale into the lungs every  6 (six) hours as needed for wheezing or shortness of breath.   Yes Historical Provider, MD  albuterol (PROVENTIL) (5 MG/ML) 0.5% nebulizer solution Take 2.5 mg by nebulization every 6 (six) hours as needed for wheezing or shortness of breath.   Yes Historical Provider, MD  phentermine 37.5 MG capsule Take 37.5 mg by mouth every morning.   Yes Historical Provider, MD  azithromycin (ZITHROMAX) 250 MG tablet Take 1 tablet (250 mg total) by mouth daily. Take first 2 tablets together, then 1 every day until finished. 01/06/14   Linwood DibblesJon Gimena Buick, MD  HYDROcodone-acetaminophen (NORCO/VICODIN) 5-325 MG per tablet Take 1-2 tablets by mouth every 4 (four) hours as needed. 01/06/14   Linwood DibblesJon Devere Brem, MD  predniSONE (DELTASONE) 50 MG tablet Take 1 tablet (50 mg total) by mouth daily. 01/06/14   Linwood DibblesJon Fantasia Jinkins, MD   BP 112/61  Pulse 87  Temp(Src) 98.2 F (36.8 C) (Oral)  Resp 24  SpO2 99%  LMP 12/30/2013 Physical Exam  Nursing note and vitals reviewed. Constitutional: No distress.  Obese  HENT:  Head: Normocephalic and atraumatic.  Right Ear: External ear normal.  Left Ear: External ear normal.  Eyes: Conjunctivae are normal. Right eye exhibits no discharge. Left eye exhibits no discharge. No scleral icterus.  Neck: Neck supple. No tracheal deviation present.  Cardiovascular: Normal rate, regular rhythm and intact distal pulses.   Pulmonary/Chest: Effort normal and breath sounds normal. No stridor. No respiratory distress. She has no wheezes. She has no rales.  Abdominal: Soft. Bowel sounds are normal. She exhibits no distension. There is no tenderness. There is no rebound and no guarding.  Musculoskeletal: She exhibits no edema and no tenderness.  Neurological: She is alert. She has normal strength. No cranial nerve deficit (no facial droop, extraocular movements intact, no slurred speech) or sensory deficit. She exhibits normal muscle tone. She displays no seizure activity. Coordination normal.  Skin: Skin is warm  and dry. No rash noted.  Psychiatric: She has a normal mood and affect.    ED Course  Procedures (including critical care time) Labs Review Labs Reviewed  BASIC METABOLIC PANEL - Abnormal; Notable for the following:    Potassium 3.5 (*)    Glucose, Bld 106 (*)    Anion gap 18 (*)    All other components within normal limits  CBC WITH DIFFERENTIAL - Abnormal; Notable for the following:    WBC 11.6 (*)    Hemoglobin 11.4 (*)    HCT 34.0 (*)    Neutrophils Relative % 80 (*)    Neutro Abs 9.3 (*)    All other components within normal limits  D-DIMER, QUANTITATIVE    Imaging Review Dg Chest 2 View  01/06/2014   CLINICAL DATA:  Shortness of breath. Fever over the last day. Anterior chest pain.  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  Cardiac and mediastinal margins appear normal. No pleural effusion suspected mild airspace opacity projecting over the posterior cardiac border, possibly in the right lower lobe.  IMPRESSION: 1. Indistinct right lower lobe opacity could reflect early pneumonia. 2. 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   Electronically Signed   By: Herbie BaltimoreWalt  Liebkemann M.D.   On: 01/06/2014 08:26     EKG Interpretation None      MDM   Final diagnoses:  CAP (community acquired pneumonia)    Patient is breathing easily in the emergency department. I do not hear any wheezing. She's not in any distress. She has a normal oxygen saturation.    X-ray suggests the possibility of pneumonia. I will treat her for community-acquired pneumonia with azithromycin. She does have history of bronchospasm and I will discharge her home on a course of steroids and have her continue her albuterol treatments since she felt like she had been wheezing at home this past week.  We discussed close outpatient follow up with her primary care doctor    Linwood DibblesJon Evelette Hollern, MD 01/06/14 [phone]

## 2014-01-06 NOTE — ED Notes (Signed)
Patient currently getting a nebulizer treatment prior to being discharged.    Family sitting with the patient.

## 2014-01-06 NOTE — ED Notes (Signed)
C/o sob, chest tightness, fever, nvd (denies: cough), arrives anxious, intermitent hyperventilating, tachypneic attempting to slow her breathing, c/o hands tingling. States has used her albuterol x6 at home without relief. LS CTA, moving good air. Skin W&D

## 2014-01-06 NOTE — Discharge Instructions (Signed)
Pneumonia, Adult  Pneumonia is an infection of the lungs. It may be caused by a germ (virus or bacteria). Some types of pneumonia can spread easily from person to person. This can happen when you cough or sneeze.  HOME CARE   Only take medicine as told by your doctor.   Take your medicine (antibiotics) as told. Finish it even if you start to feel better.   Do not smoke.   You may use a vaporizer or humidifier in your room. This can help loosen thick spit (mucus).   Sleep so you are almost sitting up (semi-upright). This helps reduce coughing.   Rest.  A shot (vaccine) can help prevent pneumonia. Shots are often advised for:   People over 65 years old.   Patients on chemotherapy.   People with long-term (chronic) lung problems.   People with immune system problems.  GET HELP RIGHT AWAY IF:    You are getting worse.   You cannot control your cough, and you are losing sleep.   You cough up blood.   Your pain gets worse, even with medicine.   You have a fever.   Any of your problems are getting worse, not better.   You have shortness of breath or chest pain.  MAKE SURE YOU:    Understand these instructions.   Will watch your condition.   Will get help right away if you are not doing well or get worse.  Document Released: 08/19/2007 Document Revised: 05/25/2011 Document Reviewed: 05/23/2010  [employer] Patient Information 2015 [employer]. This information is not intended to replace advice given to you by your health care provider. Make sure you discuss any questions you have with your health care provider.

## 2014-01-15 ENCOUNTER — Encounter (HOSPITAL_COMMUNITY): Payer: Self-pay | Admitting: Emergency Medicine

## 2014-03-16 HISTORY — PX: LAPAROSCOPIC GASTRIC SLEEVE RESECTION: SHX5895

## 2015-07-27 DIAGNOSIS — J45909 Unspecified asthma, uncomplicated: Secondary | ICD-10-CM | POA: Diagnosis not present

## 2015-07-27 DIAGNOSIS — Z8719 Personal history of other diseases of the digestive system: Secondary | ICD-10-CM | POA: Insufficient documentation

## 2015-07-27 DIAGNOSIS — Z79899 Other long term (current) drug therapy: Secondary | ICD-10-CM | POA: Diagnosis not present

## 2015-07-27 DIAGNOSIS — H53149 Visual discomfort, unspecified: Secondary | ICD-10-CM | POA: Diagnosis not present

## 2015-07-27 DIAGNOSIS — F419 Anxiety disorder, unspecified: Secondary | ICD-10-CM | POA: Diagnosis not present

## 2015-07-27 DIAGNOSIS — R51 Headache: Secondary | ICD-10-CM | POA: Insufficient documentation

## 2015-07-27 DIAGNOSIS — F17210 Nicotine dependence, cigarettes, uncomplicated: Secondary | ICD-10-CM | POA: Diagnosis not present

## 2015-07-28 ENCOUNTER — Emergency Department (HOSPITAL_COMMUNITY): Payer: BLUE CROSS/BLUE SHIELD

## 2015-07-28 ENCOUNTER — Emergency Department (HOSPITAL_COMMUNITY)
Admission: EM | Admit: 2015-07-28 | Discharge: 2015-07-28 | Disposition: A | Discharge status: Home / Self Care | Payer: BLUE CROSS/BLUE SHIELD | Attending: Emergency Medicine | Admitting: Emergency Medicine

## 2015-07-28 ENCOUNTER — Encounter (HOSPITAL_COMMUNITY): Payer: Self-pay | Admitting: Emergency Medicine

## 2015-07-28 DIAGNOSIS — R519 Headache, unspecified: Secondary | ICD-10-CM

## 2015-07-28 DIAGNOSIS — R51 Headache: Secondary | ICD-10-CM

## 2015-07-28 MED ORDER — PROCHLORPERAZINE EDISYLATE 5 MG/ML IJ SOLN
10.0000 mg | Freq: Once | INTRAMUSCULAR | Status: AC
  Administered 2015-07-28: 10 mg via INTRAMUSCULAR
  Filled 2015-07-28: qty 2

## 2015-07-28 MED ORDER — KETOROLAC TROMETHAMINE 60 MG/2ML IM SOLN
60.0000 mg | Freq: Once | INTRAMUSCULAR | Status: AC
  Administered 2015-07-28: 60 mg via INTRAMUSCULAR
  Filled 2015-07-28: qty 2

## 2015-07-28 NOTE — ED Provider Notes (Signed)
CSN: [id number]     Arrival date & time 07/27/15  2340 History   First MD Initiated Contact with Patient 07/28/15 0114     No chief complaint on file.    (Consider location/radiation/quality/duration/timing/severity/associated sxs/prior Treatment) HPI ABRA LINGENFELTER is a 30 y.o. female with history of asthma, gastric ulcer, presents to emergency department complaining of a headache and anxiety. Patient states she has had headache for approximately 3 months. She states she has seen her ophthalmologist when it started and had her prescription changed. States it did not help her headache. She has also seen her primary care doctor who thought that she may have had tension headaches and told to take at the periphery and Tylenol but she has been taking. She states Tylenol and ibuprofen is not helping her headache. She denies blurred vision, denies ataxia, denies any numbness or weakness in extremities. Patient states that "my pain is all over and it feels like my brain is exploding." Patient also states that she has been watching [employer]'s [employer] today and had a panic attack. She states that the patient in the movie had a brain tumor. Patient states that her headache is not change throughout the day. It is constant. States it is 10 out of 10. She admits to photophobia and phonophobia. Denies nausea or vomiting. Denies fever. No neck pain or stiffness. No other complaints.  Past Medical History  Diagnosis Date  . Asthma   . Morbid obesity (HCC)   . Gastric ulcer   . Abnormal EKG    History reviewed. No pertinent past surgical history. Family History  Problem Relation Age of Onset  . Hypertension Other   . Diabetes Other    Social History  Substance Use Topics  . Smoking status: Current Some Day Smoker    Types: Cigarettes  . Smokeless tobacco: None     Comment: In process of quitting smoking; smokes 2 cigarettes per day  . Alcohol Use: Yes     Comment: occassional   OB History    Gravida  Para Term Preterm AB TAB SAB Ectopic Multiple Living   Review of Systems  Constitutional: Negative for fever and chills.  Respiratory: Negative for cough, chest tightness and shortness of breath.   Cardiovascular: Negative for chest pain, palpitations and leg swelling.  Gastrointestinal: Negative for nausea, vomiting, abdominal pain and diarrhea.  Genitourinary: Negative for dysuria, flank pain and pelvic pain.  Musculoskeletal: Negative for myalgias, arthralgias, neck pain and neck stiffness.  Skin: Negative for rash.  Neurological: Positive for headaches. Negative for dizziness and weakness.  Psychiatric/Behavioral: The patient is nervous/anxious.   All other systems reviewed and are negative.     Allergies  Methylene blue and Contrast media  Home Medications   Prior to Admission medications   Medication Sig Start Date End Date Taking? Authorizing Provider  acetaminophen (TYLENOL) 500 MG tablet Take 1,500 mg by mouth every 6 (six) hours as needed for moderate pain.   Yes Historical Provider, MD  albuterol (PROVENTIL HFA;VENTOLIN HFA) 108 (90 BASE) MCG/ACT inhaler Inhale into the lungs every 6 (six) hours as needed for wheezing or shortness of breath.   Yes Historical Provider, MD  albuterol (PROVENTIL) (5 MG/ML) 0.5% nebulizer solution Take 2.5 mg by nebulization every 6 (six) hours as needed for wheezing or shortness of breath.   Yes Historical Provider, MD  ibuprofen (ADVIL,MOTRIN) 200 MG tablet Take 1,600 mg by mouth  every 6 (six) hours as needed for moderate pain.   Yes Historical Provider, MD  HYDROcodone-acetaminophen (NORCO/VICODIN) 5-325 MG per tablet Take 1-2 tablets by mouth every 4 (four) hours as needed. Patient not taking: Reported on 07/28/2015 01/06/14   Linwood DibblesJon Knapp, MD  predniSONE (DELTASONE) 50 MG tablet Take 1 tablet (50 mg total) by mouth daily. Patient not taking: Reported on 07/28/2015 01/06/14   Linwood DibblesJon Knapp, MD   BP 134/100 mmHg  Pulse 125   Temp(Src) 98.2 F (36.8 C) (Oral)  Resp 18  SpO2 100% Physical Exam  Constitutional: She is oriented to person, place, and time. She appears well-developed and well-nourished. No distress.  HENT:  Head: Normocephalic.  Eyes: Conjunctivae and EOM are normal. Pupils are equal, round, and reactive to light.  Neck: Normal range of motion. Neck supple.  No meningismus  Cardiovascular: Normal rate, regular rhythm and normal heart sounds.   Pulmonary/Chest: Effort normal and breath sounds normal. No respiratory distress. She has no wheezes. She has no rales.  Abdominal: Soft. Bowel sounds are normal. She exhibits no distension. There is no tenderness. There is no rebound.  Musculoskeletal: She exhibits no edema.  Neurological: She is alert and oriented to person, place, and time. No cranial nerve deficit. Coordination normal.  5/5 and equal upper and lower extremity strength bilaterally. Equal grip strength bilaterally. Normal finger to nose and heel to shin. No pronator drift.   Skin: Skin is warm and dry.  Psychiatric: She has a normal mood and affect. Her behavior is normal.  Nursing note and vitals reviewed.   ED Course  Procedures (including critical care time) Labs Review Labs Reviewed - No data to display  Imaging Review Ct Head Wo Contrast  07/28/2015  CLINICAL DATA:  Headache for 1 month.  Panic attack. EXAM: CT HEAD WITHOUT CONTRAST TECHNIQUE: Contiguous axial images were obtained from the base of the skull through the vertex without intravenous contrast. COMPARISON:  CT HEAD March 21, 2009 FINDINGS: INTRACRANIAL CONTENTS: The ventricles and sulci are normal. No intraparenchymal hemorrhage, mass effect nor midline shift. No acute large vascular territory infarcts. No abnormal extra-axial fluid collections. Basal cisterns are patent. ORBITS: The included ocular globes and orbital contents are normal. SINUSES: Similar probable mucosal retention cysts at fronto ethmoidal recess. Ethmoid  air cells are well aerated. SKULL/SOFT TISSUES: No skull fracture. No significant soft tissue swelling. IMPRESSION: Normal CT HEAD. Electronically Signed   By: Awilda Metroourtnay  Bloomer M.D.   On: 07/28/2015 02:18   I have personally reviewed and evaluated these images and lab results as part of my medical decision-making.   EKG Interpretation None      MDM   Final diagnoses:  Nonintractable headache, unspecified chronicity pattern, unspecified headache type   Patient in emergency department with persistent headaches over the last 3 months that are worsening. She came in with a panic attack, but appears to be calm at this time. Will treat with Toradol and Compazine, will get CT of the head.  2:32 AM Patient CT scan is negative. She just received her medications. No neuro deficits on exam. She appears to be in no distress. She is afebrile. No nuchal rigidity to suggest meningitis. Gradual onset of pain over the last several months. At this time patient is stable for discharge home, she has an appointment with her primary care doctor in 4 days. She reports that she had blood work done 2 days ago and does not know results but she has not gotten any calls. Advised  to continue taking Tylenol or Motrin, advised to try Excedrin. Discussed good sleeping and eating habits. Plan to follow-up with primary care doctor scheduled and return precautions discussed.  Filed Vitals:   07/28/15 0022 07/28/15 0214  BP: 134/100 121/94  Pulse: 125 77  Temp: 98.2 F (36.8 C)   TempSrc: Oral   Resp: 18 18  SpO2: 100% 100%     Jaynie Crumble, PA-C 07/28/15 0507  Devoria Albe, MD 07/28/15 [id number]

## 2015-07-28 NOTE — ED Notes (Signed)
Patient presents for HA x1 month, denies blurred or double vision. Patient reports panic attack PTA. Patient is anxious in triage. Encouraged to work on controlling breathing and other stress relieving interventions that work at home. Rates pain 10/10.

## 2015-07-28 NOTE — Discharge Instructions (Signed)
Continue tylenol and motrin for pain. Try Exedrin. Follow up with your doctor as scheduled. Return if worsening.    General Headache Without Cause A headache is pain or discomfort felt around the head or neck area. There are many causes and types of headaches. In some cases, the cause may not be found.  HOME CARE  Managing Pain  Take over-the-counter and prescription medicines only as told by your doctor.  Lie down in a dark, quiet room when you have a headache.  If directed, apply ice to the head and neck area:  Put ice in a plastic bag.  Place a towel between your skin and the bag.  Leave the ice on for 20 minutes, 2-3 times per day.  Use a heating pad or hot shower to apply heat to the head and neck area as told by your doctor.  Keep lights dim if bright lights bother you or make your headaches worse. Eating and Drinking  Eat meals on a regular schedule.  Lessen how much alcohol you drink.  Lessen how much caffeine you drink, or stop drinking caffeine. General Instructions  Keep all follow-up visits as told by your doctor. This is important.  Keep a journal to find out if certain things bring on headaches. For example, write down:  What you eat and drink.  How much sleep you get.  Any change to your diet or medicines.  Relax by getting a massage or doing other relaxing activities.  Lessen stress.  Sit up straight. Do not tighten (tense) your muscles.  Do not use tobacco products. This includes cigarettes, chewing tobacco, or e-cigarettes. If you need help quitting, ask your doctor.  Exercise regularly as told by your doctor.  Get enough sleep. This often means 7-9 hours of sleep. GET HELP IF:  Your symptoms are not helped by medicine.  You have a headache that feels different than the other headaches.  You feel sick to your stomach (nauseous) or you throw up (vomit).  You have a fever. GET HELP RIGHT AWAY IF:   Your headache becomes really  bad.  You keep throwing up.  You have a stiff neck.  You have trouble seeing.  You have trouble speaking.  You have pain in the eye or ear.  Your muscles are weak or you lose muscle control.  You lose your balance or have trouble walking.  You feel like you will pass out (faint) or you pass out.  You have confusion.   This information is not intended to replace advice given to you by your health care provider. Make sure you discuss any questions you have with your health care provider.   Document Released: 12/10/2007 Document Revised: 11/21/2014 Document Reviewed: 06/25/2014 Elsevier Interactive Patient Education [employer]2016 Elsevier Inc.

## 2016-06-24 ENCOUNTER — Emergency Department (HOSPITAL_COMMUNITY): Payer: BLUE CROSS/BLUE SHIELD

## 2016-06-24 ENCOUNTER — Encounter (HOSPITAL_COMMUNITY): Payer: Self-pay | Admitting: *Deleted

## 2016-06-24 ENCOUNTER — Inpatient Hospital Stay (HOSPITAL_COMMUNITY)
Admission: EM | Admit: 2016-06-24 | Discharge: 2016-06-28 | DRG: 439 | Disposition: A | Discharge status: Home / Self Care | Payer: BLUE CROSS/BLUE SHIELD | Attending: Internal Medicine | Admitting: Internal Medicine

## 2016-06-24 DIAGNOSIS — Z6836 Body mass index (BMI) 36.0-36.9, adult: Secondary | ICD-10-CM

## 2016-06-24 DIAGNOSIS — R188 Other ascites: Secondary | ICD-10-CM | POA: Diagnosis present

## 2016-06-24 DIAGNOSIS — D649 Anemia, unspecified: Secondary | ICD-10-CM | POA: Diagnosis present

## 2016-06-24 DIAGNOSIS — J45909 Unspecified asthma, uncomplicated: Secondary | ICD-10-CM | POA: Diagnosis present

## 2016-06-24 DIAGNOSIS — R945 Abnormal results of liver function studies: Secondary | ICD-10-CM

## 2016-06-24 DIAGNOSIS — E669 Obesity, unspecified: Secondary | ICD-10-CM | POA: Diagnosis present

## 2016-06-24 DIAGNOSIS — R1013 Epigastric pain: Secondary | ICD-10-CM

## 2016-06-24 DIAGNOSIS — Z79899 Other long term (current) drug therapy: Secondary | ICD-10-CM | POA: Diagnosis not present

## 2016-06-24 DIAGNOSIS — Q453 Other congenital malformations of pancreas and pancreatic duct: Secondary | ICD-10-CM | POA: Diagnosis not present

## 2016-06-24 DIAGNOSIS — Z91041 Radiographic dye allergy status: Secondary | ICD-10-CM | POA: Diagnosis not present

## 2016-06-24 DIAGNOSIS — K852 Alcohol induced acute pancreatitis without necrosis or infection: Principal | ICD-10-CM | POA: Diagnosis present

## 2016-06-24 DIAGNOSIS — Z888 Allergy status to other drugs, medicaments and biological substances status: Secondary | ICD-10-CM | POA: Diagnosis not present

## 2016-06-24 DIAGNOSIS — E876 Hypokalemia: Secondary | ICD-10-CM | POA: Diagnosis present

## 2016-06-24 DIAGNOSIS — K859 Acute pancreatitis without necrosis or infection, unspecified: Secondary | ICD-10-CM | POA: Diagnosis not present

## 2016-06-24 DIAGNOSIS — F17210 Nicotine dependence, cigarettes, uncomplicated: Secondary | ICD-10-CM | POA: Diagnosis present

## 2016-06-24 DIAGNOSIS — R7989 Other specified abnormal findings of blood chemistry: Secondary | ICD-10-CM

## 2016-06-24 DIAGNOSIS — Z8711 Personal history of peptic ulcer disease: Secondary | ICD-10-CM | POA: Diagnosis not present

## 2016-06-24 DIAGNOSIS — K8500 Idiopathic acute pancreatitis without necrosis or infection: Secondary | ICD-10-CM | POA: Diagnosis not present

## 2016-06-24 HISTORY — DX: Migraine, unspecified, not intractable, without status migrainosus: G43.909

## 2016-06-24 HISTORY — DX: Anxiety disorder, unspecified: F41.9

## 2016-06-24 LAB — CBC WITH DIFFERENTIAL/PLATELET
BASOS ABS: 0 10*3/uL (ref 0.0–0.1)
BASOS PCT: 0 %
Eosinophils Absolute: 0 10*3/uL (ref 0.0–0.7)
Eosinophils Relative: 0 %
HEMATOCRIT: 32.2 % — AB (ref 36.0–46.0)
HEMOGLOBIN: 11.2 g/dL — AB (ref 12.0–15.0)
LYMPHS PCT: 11 %
Lymphs Abs: 0.8 10*3/uL (ref 0.7–4.0)
MCH: 34.3 pg — ABNORMAL HIGH (ref 26.0–34.0)
MCHC: 34.8 g/dL (ref 30.0–36.0)
MCV: 98.5 fL (ref 78.0–100.0)
MONO ABS: 0.6 10*3/uL (ref 0.1–1.0)
MONOS PCT: 9 %
Neutro Abs: 5.8 10*3/uL (ref 1.7–7.7)
Neutrophils Relative %: 80 %
Platelets: 209 10*3/uL (ref 150–400)
RBC: 3.27 MIL/uL — ABNORMAL LOW (ref 3.87–5.11)
RDW: 13.8 % (ref 11.5–15.5)
WBC: 7.3 10*3/uL (ref 4.0–10.5)

## 2016-06-24 LAB — PHOSPHORUS: PHOSPHORUS: 2.8 mg/dL (ref 2.5–4.6)

## 2016-06-24 LAB — CBC
HEMATOCRIT: 32.6 % — AB (ref 36.0–46.0)
HEMOGLOBIN: 11.5 g/dL — AB (ref 12.0–15.0)
MCH: 34.6 pg — ABNORMAL HIGH (ref 26.0–34.0)
MCHC: 35.3 g/dL (ref 30.0–36.0)
MCV: 98.2 fL (ref 78.0–100.0)
Platelets: 216 10*3/uL (ref 150–400)
RBC: 3.32 MIL/uL — AB (ref 3.87–5.11)
RDW: 14 % (ref 11.5–15.5)
WBC: 7.8 10*3/uL (ref 4.0–10.5)

## 2016-06-24 LAB — LIPASE, BLOOD: Lipase: 371 U/L — ABNORMAL HIGH (ref 11–51)

## 2016-06-24 LAB — MAGNESIUM: Magnesium: 1.4 mg/dL — ABNORMAL LOW (ref 1.7–2.4)

## 2016-06-24 LAB — COMPREHENSIVE METABOLIC PANEL
ALT: 64 U/L — ABNORMAL HIGH (ref 14–54)
ANION GAP: 8 (ref 5–15)
AST: 88 U/L — ABNORMAL HIGH (ref 15–41)
Albumin: 3.2 g/dL — ABNORMAL LOW (ref 3.5–5.0)
Alkaline Phosphatase: 131 U/L — ABNORMAL HIGH (ref 38–126)
BUN: 5 mg/dL — ABNORMAL LOW (ref 6–20)
CO2: 24 mmol/L (ref 22–32)
Calcium: 8.2 mg/dL — ABNORMAL LOW (ref 8.9–10.3)
Chloride: 105 mmol/L (ref 101–111)
Creatinine, Ser: 0.7 mg/dL (ref 0.44–1.00)
GFR calc Af Amer: >60 mL/min (ref >60)
Glucose, Bld: 136 mg/dL — ABNORMAL HIGH (ref 65–99)
POTASSIUM: 3.7 mmol/L (ref 3.5–5.1)
Sodium: 137 mmol/L (ref 135–145)
Total Bilirubin: 1.5 mg/dL — ABNORMAL HIGH (ref 0.3–1.2)
Total Protein: 6.3 g/dL — ABNORMAL LOW (ref 6.5–8.1)

## 2016-06-24 LAB — I-STAT BETA HCG BLOOD, ED (MC, WL, AP ONLY)

## 2016-06-24 MED ORDER — PROMETHAZINE HCL 25 MG/ML IJ SOLN
12.5000 mg | Freq: Once | INTRAMUSCULAR | Status: AC
  Administered 2016-06-24: 12.5 mg via INTRAVENOUS
  Filled 2016-06-24: qty 1

## 2016-06-24 MED ORDER — DIAZEPAM 5 MG PO TABS
5.0000 mg | ORAL_TABLET | Freq: Two times a day (BID) | ORAL | Status: DC | PRN
  Administered 2016-06-24 – 2016-06-27 (×6): 5 mg via ORAL
  Filled 2016-06-24 (×6): qty 1

## 2016-06-24 MED ORDER — SODIUM CHLORIDE 0.9 % IV BOLUS (SEPSIS)
1000.0000 mL | Freq: Once | INTRAVENOUS | Status: AC
  Administered 2016-06-24: 1000 mL via INTRAVENOUS

## 2016-06-24 MED ORDER — SODIUM CHLORIDE 0.9 % IV SOLN
INTRAVENOUS | Status: DC
  Administered 2016-06-24 – 2016-06-25 (×2): via INTRAVENOUS

## 2016-06-24 MED ORDER — ONDANSETRON HCL 4 MG PO TABS
4.0000 mg | ORAL_TABLET | Freq: Four times a day (QID) | ORAL | Status: DC | PRN
  Administered 2016-06-27: 4 mg via ORAL
  Filled 2016-06-24: qty 1

## 2016-06-24 MED ORDER — ONDANSETRON HCL 4 MG/2ML IJ SOLN
4.0000 mg | Freq: Four times a day (QID) | INTRAMUSCULAR | Status: DC | PRN
  Administered 2016-06-25 – 2016-06-26 (×3): 4 mg via INTRAVENOUS
  Filled 2016-06-24 (×3): qty 2

## 2016-06-24 MED ORDER — MORPHINE SULFATE (PF) 2 MG/ML IV SOLN
4.0000 mg | Freq: Once | INTRAVENOUS | Status: AC
  Administered 2016-06-24: 4 mg via INTRAVENOUS
  Filled 2016-06-24: qty 2

## 2016-06-24 MED ORDER — HYDROMORPHONE HCL 2 MG/ML IJ SOLN
1.0000 mg | INTRAMUSCULAR | Status: DC | PRN
  Administered 2016-06-24 – 2016-06-27 (×12): 1 mg via INTRAVENOUS
  Filled 2016-06-24 (×12): qty 1

## 2016-06-24 MED ORDER — SODIUM CHLORIDE 0.9% FLUSH
3.0000 mL | Freq: Two times a day (BID) | INTRAVENOUS | Status: DC
  Administered 2016-06-24 – 2016-06-27 (×5): 3 mL via INTRAVENOUS

## 2016-06-24 MED ORDER — ZONISAMIDE 100 MG PO CAPS
100.0000 mg | ORAL_CAPSULE | Freq: Every day | ORAL | Status: DC
  Administered 2016-06-24 – 2016-06-27 (×4): 100 mg via ORAL
  Filled 2016-06-24 (×4): qty 1

## 2016-06-24 MED ORDER — ACETAMINOPHEN 325 MG PO TABS: 650.0000 mg | ORAL_TABLET | Freq: Four times a day (QID) | ORAL | Status: DC | PRN

## 2016-06-24 MED ORDER — ESCITALOPRAM OXALATE 20 MG PO TABS
20.0000 mg | ORAL_TABLET | Freq: Every day | ORAL | Status: DC
  Administered 2016-06-25 – 2016-06-28 (×4): 20 mg via ORAL
  Filled 2016-06-24 (×4): qty 1

## 2016-06-24 NOTE — ED Provider Notes (Signed)
[facility] DEPT Provider Note   CSN: [id number] Arrival date & time: 06/24/16  1415     History   Chief Complaint Chief Complaint  Patient presents with  . Abdominal Pain    HPI Sara Contreras is a 31 y.o. female.  The history is provided by the patient and medical records. No language interpreter was used.  Abdominal Pain   Associated symptoms include diarrhea, nausea and vomiting. Pertinent negatives include fever and constipation.   Sara Contreras is a 31 y.o. female  with a PMH of obesity, asthma who presents to the Emergency Department complaining of Acute onset of epigastric and right upper quadrant pain that began at 4 AM this morning. Associated symptoms include nausea and multiple episodes of emesis. + Nonbloody diarrhea throughout the day as well. Patient states he has not been able to keep any food or fluids down since onset of pain. No history of similar sxs. Patient denies NSAID use. Drinks a glass of wine or 2 beers daily. No history of gallstones or gallbladder issues. She does have a history of gastric ulcer in the past. She went to the urgent care earlier today where she was told to come to ER for further evaluation. She was given 8 mg of Zofran ODT at urgent care, then 4 mg Zofran IV by EMS. She also received 1 L of normal saline prior to arrival.   Past Medical History:  Diagnosis Date  . Abnormal EKG   . Asthma   . Gastric ulcer   . Morbid obesity [facility])     Patient Active Problem List   Diagnosis Date Noted  . Acute pancreatitis 06/24/2016    History reviewed. No pertinent surgical history.  OB History    Gravida Para Term Preterm AB Living   1 1 1          SAB TAB Ectopic Multiple Live Births                   Home Medications    Prior to Admission medications   Medication Sig Start Date End Date Taking? Authorizing Provider  acetaminophen (TYLENOL) 500 MG tablet Take 1,500 mg by mouth every 6 (six) hours as needed for moderate pain.    Yes Historical Provider, MD  albuterol (PROVENTIL HFA;VENTOLIN HFA) 108 (90 BASE) MCG/ACT inhaler Inhale into the lungs every 6 (six) hours as needed for wheezing or shortness of breath.   Yes Historical Provider, MD  albuterol (PROVENTIL) (5 MG/ML) 0.5% nebulizer solution Take 2.5 mg by nebulization every 6 (six) hours as needed for wheezing or shortness of breath.   Yes Historical Provider, MD  diazepam (VALIUM) 5 MG tablet Take 5 mg by mouth 2 (two) times daily as needed for anxiety.  04/24/16  Yes Historical Provider, MD  escitalopram (LEXAPRO) 20 MG tablet Take 20 mg by mouth daily. 06/19/16  Yes Historical Provider, MD  ibuprofen (ADVIL,MOTRIN) 200 MG tablet Take 1,600 mg by mouth every 6 (six) hours as needed for moderate pain.   Yes Historical Provider, MD  Multiple Vitamins-Minerals (MULTIVITAMIN ADULTS PO) Take 1 tablet by mouth daily.   Yes Historical Provider, MD  zonisamide (ZONEGRAN) 100 MG capsule Take 100 mg by mouth at bedtime. 06/19/16  Yes Historical Provider, MD  predniSONE (DELTASONE) 50 MG tablet Take 1 tablet (50 mg total) by mouth daily. Patient not taking: Reported on 06/24/2016 01/06/14   Dorie Rank, MD    Family History Family History  Problem Relation Age of Onset  .  Hypertension Other   . Diabetes Other     Social History Social History  Substance Use Topics  . Smoking status: Current Some Day Smoker    Types: Cigarettes  . Smokeless tobacco: Never Used     Comment: In process of quitting smoking; smokes 2 cigarettes per day  . Alcohol use Yes     Comment: occassional     Allergies   Methylene blue and Contrast media [iodinated diagnostic agents]   Review of Systems Review of Systems  Constitutional: Negative for chills and fever.  HENT: Negative for congestion.   Respiratory: Negative for cough and shortness of breath.   Cardiovascular: Negative for chest pain.  Gastrointestinal: Positive for abdominal pain, diarrhea, nausea and vomiting. Negative for  blood in stool and constipation.  All other systems reviewed and are negative.    Physical Exam Updated Vital Signs BP 109/78   Pulse 87   Temp 98.1 F (36.7 C) (Oral)   Resp 18   Ht 5' 7.5" (1.715 m)   Wt 102.1 kg   SpO2 99%   BMI 34.72 kg/m   Physical Exam  Constitutional: She is oriented to person, place, and time. She appears well-developed and well-nourished. No distress.  HENT:  Head: Normocephalic and atraumatic.  Cardiovascular: Normal rate, regular rhythm and normal heart sounds.   No murmur heard. Pulmonary/Chest: Effort normal and breath sounds normal. No respiratory distress.  Abdominal: Soft. Bowel sounds are normal. She exhibits no distension and no mass. There is tenderness (Epigastric and RUQ). There is guarding. There is no rebound.  Musculoskeletal: She exhibits no edema.  Neurological: She is alert and oriented to person, place, and time.  Skin: Skin is warm and dry.  Nursing note and vitals reviewed.    ED Treatments / Results  Labs (all labs ordered are listed, but only abnormal results are displayed) Labs Reviewed  LIPASE, BLOOD - Abnormal; Notable for the following:       Result Value   Lipase 371 (*)    All other components within normal limits  COMPREHENSIVE METABOLIC PANEL - Abnormal; Notable for the following:    Glucose, Bld 136 (*)    BUN 5 (*)    Calcium 8.2 (*)    Total Protein 6.3 (*)    Albumin 3.2 (*)    AST 88 (*)    ALT 64 (*)    Alkaline Phosphatase 131 (*)    Total Bilirubin 1.5 (*)    All other components within normal limits  CBC - Abnormal; Notable for the following:    RBC 3.32 (*)    Hemoglobin 11.5 (*)    HCT 32.6 (*)    MCH 34.6 (*)    All other components within normal limits  URINALYSIS, ROUTINE W REFLEX MICROSCOPIC  LIPASE, BLOOD  I-STAT BETA HCG BLOOD, ED ([facility], [facility], [facility] ONLY)  POC URINE PREG, ED    EKG  EKG Interpretation None       Radiology US Abdomen Limited Ruq  Result Date: 06/24/2016 CLINICAL  DATA:  31 year old female with acute right upper quadrant and epigastric abdominal pain since 0400 hours. Initial encounter. EXAM: US ABDOMEN LIMITED - RIGHT UPPER QUADRANT COMPARISON:  CT Abdomen and Pelvis without contrast 07/25/2012 FINDINGS: Gallbladder: No gallstones or wall thickening visualized. No sonographic Murphy sign noted by sonographer. Common bile duct: Diameter: 5 mm common normal Liver: Liver echogenicity is within normal limits. No liver lesion or intrahepatic biliary ductal dilatation identified. Other findings: Small volume of perihepatic  free fluid including in Morison pouch between the liver and kidney (image 11). Otherwise negative visible right kidney. Mild pancreatic ductal dilatation, 3-4 mm (image 30). Probable small volume of peripancreatic fluid. IMPRESSION: 1. Small volume nonspecific perihepatic and probably also peripancreatic abdominal fluid. Consider Acute Pancreatitis and recommend follow-up CT Abdomen and Pelvis (oral and IV contrast preferred). 2. Normal gallbladder. No CBD enlargement, but possible main pancreatic duct obstruction. Electronically Signed   By: Genevie Ann M.D.   On: 06/24/2016 16:23    Procedures Procedures (including critical care time)  Medications Ordered in ED Medications  HYDROmorphone (DILAUDID) injection 1 mg (not administered)  sodium chloride 0.9 % bolus 1,000 mL (1,000 mLs Intravenous New Bag/Given 06/24/16 1644)  morphine 2 MG/ML injection 4 mg (4 mg Intravenous Given 06/24/16 1646)  promethazine (PHENERGAN) injection 12.5 mg (12.5 mg Intravenous Given 06/24/16 1644)     Initial Impression / Assessment and Plan / ED Course  I have reviewed the triage vital signs and the nursing notes.  Pertinent labs & imaging results that were available during my care of the patient were reviewed by me and considered in my medical decision making (see chart for details).    Sara Contreras is a 31 y.o. female who presents to ED for acute onset of  epigastric abdominal pain at 4 am this morning associated with nausea and vomiting. On exam, patient is afebrile with tenderness to epigastrium and RUQ with guarding. Labs reviewed and notable for lipase of 371, AST 88, ALT 64, Alk phos 131 and bili of 1.5. RUQ ultrasound obtained: IMPRESSION: 1. Small volume nonspecific perihepatic and probably also peripancreatic abdominal fluid. Consider Acute Pancreatitis and recommend follow-up CT Abdomen and Pelvis (oral and IV contrast preferred). 2. Normal gallbladder. No CBD enlargement, but possible main pancreatic duct obstruction.  Consulted hospitalist who will admit. Discussed ultrasound results and recommendation of CT abd/pelvic - patient does have contrast allergy. Hospitalist asked for me to consult GI which I have done. I spoke with Dr. Penelope Coop who states GI will evaluate patient in the morning.   Patient discussed with Dr. Leonette Monarch who agrees with treatment plan.    Final Clinical Impressions(s) / ED Diagnoses   Final diagnoses:  Epigastric abdominal pain  Acute pancreatitis, unspecified complication status, unspecified pancreatitis type    New Prescriptions New Prescriptions   No medications on file     [facility] Diamantina Edinger, PA-C 06/24/16 1944    Fatima Blank, MD 06/25/16 0130

## 2016-06-24 NOTE — ED Notes (Signed)
Pt states that her RLQ pain is increased.  Advised her that we are working on getting her a room in the back as soon as possible.

## 2016-06-24 NOTE — ED Notes (Signed)
Pt is aware urine sample is needed. 

## 2016-06-24 NOTE — ED Notes (Signed)
EDPA Provider at bedside. 

## 2016-06-24 NOTE — ED Notes (Signed)
Pt was seen at [facility] for abdominal pain and nausea and loose stools.  Pt appears pale and was sent to ED for further work up.  Pt had  zofran ODT by [facility].  Pt had another  IV zofran from ems.  Pt had 20g in LAC.  Pt had 1L NS pta.  Pt also was given immodium by [facility]

## 2016-06-24 NOTE — ED Notes (Signed)
ADMITTING Provider at bedside. 

## 2016-06-24 NOTE — H&P (Signed)
History and Physical    CATRENA VARI GEX:[id number] DOB: [birth year] DOA: 06/24/2016  Referring MD/NP/PA: Jennefer Bravo Ward  PCP: Georgianne Fick, MD    Patient coming from: home   Chief Complaint: abdominal pain  HPI: Sara Contreras is a 31 y.o. female with no significant past medical history other than asthma (controlled with PRN albuterol inhaler) who presented to ED with sudden onset epigastric and right upper quadrant abdominal pain started this am prior to this admission, associated with nausea and non bloody vomiting. Pt also reported associated diarrhea. Pt reported poor po intake sure to symptoms. No blood in stool. Pt has had no fevers or chills. No chest pain, no cough. No lightheadedness or loss of consciousness.  ED Course: Pt hemodynamically stable. Abd US showed small volume nonspecific perihepatic and probably also peripancreatic abdominal fluid. Follow up CT recommended. Normal gallbladder, possible main pancreatic duct obstruction. LFT's showed AST 88, ALT 64, ALP 131, Total bili 1.5. GI consulted by ED physician.    Review of Systems:  Constitutional: Negative for fever, chills, diaphoresis, activity change, appetite change and fatigue.  HENT: Negative for ear pain, nosebleeds, congestion, facial swelling, rhinorrhea, neck pain, neck stiffness and ear discharge.   Eyes: Negative for pain, discharge, redness, itching and visual disturbance.  Respiratory: Negative for cough, choking, chest tightness, shortness of breath, wheezing and stridor.   Cardiovascular: Negative for chest pain, palpitations and leg swelling.  Gastrointestinal: Negative for abdominal distention.  Genitourinary: Negative for dysuria, urgency, frequency, hematuria, flank pain, decreased urine volume, difficulty urinating and dyspareunia.  Musculoskeletal: Negative for back pain, joint swelling, arthralgias and gait problem.  Neurological: Negative for dizziness, tremors, seizures, syncope, facial  asymmetry, speech difficulty, weakness, light-headedness, numbness and headaches.  Hematological: Negative for adenopathy. Does not bruise/bleed easily.  Psychiatric/Behavioral: Negative for hallucinations, behavioral problems, confusion, dysphoric mood, decreased concentration and agitation.   Past Medical History:  Diagnosis Date  . Abnormal EKG   . Asthma   . Gastric ulcer   . Morbid obesity (HCC)     History reviewed. No pertinent surgical history.  Social history:  reports that she has been smoking Cigarettes.  She has never used smokeless tobacco. She reports that she drinks alcohol. She reports that she does not use drugs.  Ambulation: without assistance at baseline   Allergies  Allergen Reactions  . Methylene Blue Hives  . Contrast Media [Iodinated Diagnostic Agents] Hives    Family History  Problem Relation Age of Onset  . Hypertension Other   . Diabetes Other     Prior to Admission medications   Medication Sig Start Date End Date Taking? Authorizing Provider  acetaminophen (TYLENOL) 500 MG tablet Take 1,500 mg by mouth every 6 (six) hours as needed for moderate pain.   Yes Historical Provider, MD  albuterol (PROVENTIL HFA;VENTOLIN HFA) 108 (90 BASE) MCG/ACT inhaler Inhale into the lungs every 6 (six) hours as needed for wheezing or shortness of breath.   Yes Historical Provider, MD  albuterol (PROVENTIL) (5 MG/ML) 0.5% nebulizer solution Take 2.5 mg by nebulization every 6 (six) hours as needed for wheezing or shortness of breath.   Yes Historical Provider, MD  diazepam (VALIUM) 5 MG tablet Take 5 mg by mouth 2 (two) times daily as needed for anxiety.  04/24/16  Yes Historical Provider, MD  escitalopram (LEXAPRO) 20 MG tablet Take 20 mg by mouth daily. 06/19/16  Yes Historical Provider, MD  ibuprofen (ADVIL,MOTRIN) 200 MG tablet Take 1,600 mg by mouth every 6 (six) hours  as needed for moderate pain.   Yes Historical Provider, MD  Multiple Vitamins-Minerals (MULTIVITAMIN  ADULTS PO) Take 1 tablet by mouth daily.   Yes Historical Provider, MD  zonisamide (ZONEGRAN) 100 MG capsule Take 100 mg by mouth at bedtime. 06/19/16  Yes Historical Provider, MD  predniSONE (DELTASONE) 50 MG tablet Take 1 tablet (50 mg total) by mouth daily. Patient not taking: Reported on 06/24/2016 01/06/14   Linwood Dibbles, MD    Physical Exam: Vitals:   06/24/16 1652 06/24/16 1700 06/24/16 1730 06/24/16 1800  BP:  113/81 111/76 109/78  Pulse: 89 90 88 87  Resp:      Temp:      TempSrc:      SpO2: 98% 97% 99% 99%  Weight:      Height:        Constitutional: NAD, calm, comfortable Vitals:   06/24/16 1652 06/24/16 1700 06/24/16 1730 06/24/16 1800  BP:  113/81 111/76 109/78  Pulse: 89 90 88 87  Resp:      Temp:      TempSrc:      SpO2: 98% 97% 99% 99%  Weight:      Height:       Eyes: PERRL, lids and conjunctivae normal ENMT: Mucous membranes are moist. Posterior pharynx clear of any exudate or lesions.Normal dentition.  Neck: normal, supple, no masses, no thyromegaly Respiratory: clear to auscultation bilaterally, no wheezing, no crackles. Normal respiratory effort. No accessory muscle use.  Cardiovascular: Regular rate and rhythm, no murmurs / rubs / gallops. No extremity edema. 2+ pedal pulses. No carotid bruits.  Abdomen: tender in mid abdomen to palpation without rebound or guarding, (+) BS Musculoskeletal: no clubbing / cyanosis. No joint deformity upper and lower extremities. Good ROM, no contractures. Normal muscle tone.  Skin: no rashes, lesions, ulcers. No induration Neurologic: CN 2-12 grossly intact. Sensation intact, DTR normal. Strength 5/5 in all 4.  Psychiatric: Normal judgment and insight. Alert and oriented x 3. Normal mood.   Labs on Admission: I have personally reviewed following labs and imaging studies  CBC:  Recent Labs Lab 06/24/16 1437  WBC 7.8  HGB 11.5*  HCT 32.6*  MCV 98.2  PLT 216   Basic Metabolic Panel:  Recent Labs Lab 06/24/16 1437   NA 137  K 3.7  CL 105  CO2 24  GLUCOSE 136*  BUN 5*  CREATININE 0.70  CALCIUM 8.2*   GFR: Estimated Creatinine Clearance: 126.3 mL/min (by C-G formula based on SCr of 0.7 mg/dL). Liver Function Tests:  Recent Labs Lab 06/24/16 1437  AST 88*  ALT 64*  ALKPHOS 131*  BILITOT 1.5*  PROT 6.3*  ALBUMIN 3.2*    Recent Labs Lab 06/24/16 1437  LIPASE 371*   No results for input(s): AMMONIA in the last 168 hours. Coagulation Profile: No results for input(s): INR, PROTIME in the last 168 hours. Cardiac Enzymes: No results for input(s): CKTOTAL, CKMB, CKMBINDEX, TROPONINI in the last 168 hours. BNP (last 3 results) No results for input(s): PROBNP in the last 8760 hours. HbA1C: No results for input(s): HGBA1C in the last 72 hours. CBG: No results for input(s): GLUCAP in the last 168 hours. Lipid Profile: No results for input(s): CHOL, HDL, LDLCALC, TRIG, CHOLHDL, LDLDIRECT in the last 72 hours. Thyroid Function Tests: No results for input(s): TSH, T4TOTAL, FREET4, T3FREE, THYROIDAB in the last 72 hours. Anemia Panel: No results for input(s): VITAMINB12, FOLATE, FERRITIN, TIBC, IRON, RETICCTPCT in the last 72 hours. Urine analysis:  Component Value Date/Time   COLORURINE Straw 07/25/2012 1828   COLORURINE YELLOW 07/08/2011 0306   APPEARANCEUR Clear 07/25/2012 1828   LABSPEC 1.020 05/06/2013 0954   LABSPEC 1.005 07/25/2012 1828   PHURINE 7.0 05/06/2013 0954   GLUCOSEU NEGATIVE 05/06/2013 0954   GLUCOSEU Negative 07/25/2012 1828   HGBUR NEGATIVE 05/06/2013 0954   BILIRUBINUR NEGATIVE 05/06/2013 0954   BILIRUBINUR Negative 07/25/2012 1828   KETONESUR NEGATIVE 05/06/2013 0954   PROTEINUR NEGATIVE 05/06/2013 0954   UROBILINOGEN 0.2 05/06/2013 0954   NITRITE NEGATIVE 05/06/2013 0954   LEUKOCYTESUR SMALL (A) 05/06/2013 0954   LEUKOCYTESUR Negative 07/25/2012 1828   Sepsis Labs: (procalcitonin:4,lacticidven:4) )No results found for this or any previous  visit (from the past 240 hour(s)).   Radiological Exams on Admission: US Abdomen Limited Ruq Result Date: 06/24/2016 1. Small volume nonspecific perihepatic and probably also peripancreatic abdominal fluid. Consider Acute Pancreatitis and recommend follow-up CT Abdomen and Pelvis (oral and IV contrast preferred). 2. Normal gallbladder. No CBD enlargement, but possible main pancreatic duct obstruction. Electronically Signed   By: Odessa Fleming M.D.   On: 06/24/2016 16:23    EKG: Not done on admission  Assessment/Plan   Active Problems:   Acute pancreatitis / Epigastric and right upper quadrant abdominal pain / Abnormal LFT's - Abd US showed small volume nonspecific perihepatic and probably also peripancreatic abdominal fluid. Follow up CT recommended. Normal gallbladder; possible main pancreatic duct obstruction - LFT's showed AST 88, ALT 64, ALP 131, Total bili 1.5 - GI consulted by ED physician.  - Continue NPO - Continue IV fluids, antiemetics as needed and analgesia as needed     DVT prophylaxis: SCD's bilaterally  Code Status: full code Family Communication: no family at the bedside  Disposition Plan: admission to telemetry  Consults called: GI Admission status: inpatient, pt admitted with pancreatitis, made NPO and needs IV fluids and antiemetics as well as analgesia for abdominal pain. She will be seen by GI in consultation for management of pancreatitis and possible obstruction of the pancreatic duct.  Manson Passey MD [facility] Pager [phone]  If 7PM-7AM, please contact night-coverage [email or web address] Password [facility]  06/24/2016, 7:19 PM

## 2016-06-25 ENCOUNTER — Inpatient Hospital Stay (HOSPITAL_COMMUNITY): Payer: BLUE CROSS/BLUE SHIELD

## 2016-06-25 DIAGNOSIS — R7989 Other specified abnormal findings of blood chemistry: Secondary | ICD-10-CM

## 2016-06-25 DIAGNOSIS — R945 Abnormal results of liver function studies: Secondary | ICD-10-CM

## 2016-06-25 LAB — COMPREHENSIVE METABOLIC PANEL
ALT: 48 U/L (ref 14–54)
ANION GAP: 9 (ref 5–15)
AST: 50 U/L — AB (ref 15–41)
Albumin: 3.1 g/dL — ABNORMAL LOW (ref 3.5–5.0)
Alkaline Phosphatase: 119 U/L (ref 38–126)
BUN: <5 mg/dL — ABNORMAL LOW (ref 6–20)
CHLORIDE: 105 mmol/L (ref 101–111)
CO2: 24 mmol/L (ref 22–32)
Calcium: 8.3 mg/dL — ABNORMAL LOW (ref 8.9–10.3)
Creatinine, Ser: 0.85 mg/dL (ref 0.44–1.00)
GFR calc Af Amer: >60 mL/min (ref >60)
GFR calc non Af Amer: >60 mL/min (ref >60)
GLUCOSE: 111 mg/dL — AB (ref 65–99)
POTASSIUM: 3.3 mmol/L — AB (ref 3.5–5.1)
Sodium: 138 mmol/L (ref 135–145)
Total Bilirubin: 1.2 mg/dL (ref 0.3–1.2)
Total Protein: 6.1 g/dL — ABNORMAL LOW (ref 6.5–8.1)

## 2016-06-25 LAB — URINALYSIS, ROUTINE W REFLEX MICROSCOPIC
Bilirubin Urine: NEGATIVE
GLUCOSE, UA: NEGATIVE mg/dL
Hgb urine dipstick: NEGATIVE
KETONES UR: 5 mg/dL — AB
Leukocytes, UA: NEGATIVE
Nitrite: NEGATIVE
PROTEIN: NEGATIVE mg/dL
Specific Gravity, Urine: 1.023 (ref 1.005–1.030)
pH: 5 (ref 5.0–8.0)

## 2016-06-25 LAB — GLUCOSE, CAPILLARY: Glucose-Capillary: 99 mg/dL (ref 65–99)

## 2016-06-25 LAB — MRSA PCR SCREENING: MRSA by PCR: NEGATIVE

## 2016-06-25 LAB — HIV ANTIBODY (ROUTINE TESTING W REFLEX): HIV SCREEN 4TH GENERATION: NONREACTIVE

## 2016-06-25 LAB — LIPASE, BLOOD: Lipase: 318 U/L — ABNORMAL HIGH (ref 11–51)

## 2016-06-25 MED ORDER — GADOBENATE DIMEGLUMINE 529 MG/ML IV SOLN
20.0000 mL | Freq: Once | INTRAVENOUS | Status: AC | PRN
  Administered 2016-06-25: 20 mL via INTRAVENOUS

## 2016-06-25 NOTE — Consult Note (Addendum)
[facility] Consult Note  Referring Provider: No ref. provider found Primary Care Physician:  Merrilee Seashore, MD Primary Gastroenterologist:  Dr.  Laurel Dimmer Complaint: Epigastric pain HPI: Sara Contreras is an 31 y.o. black female  who presents with a one-day history of epigastric pain nausea and vomiting. She had 2 tequila shots a night before drinks alcohol regularly. 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 Ultrasound shows no gallstones but does show some evidence of pancreatitis and small amount of peripancreatic fluid. Lipase was 318 with mild elevations of liver function test.  Past Medical History:  Diagnosis Date  . Abnormal EKG   . Anxiety   . Asthma   . Gastric ulcer   . Migraines   . Morbid obesity ([place])     Past Surgical History:  Procedure Laterality Date  . LAPAROSCOPIC GASTRIC SLEEVE RESECTION  2016    Medications Prior to Admission  Medication Sig Dispense Refill  . acetaminophen (TYLENOL) 500 MG tablet Take 1,500 mg by mouth every 6 (six) hours as needed for moderate pain.    Marland Kitchen albuterol (PROVENTIL HFA;VENTOLIN HFA) 108 (90 BASE) MCG/ACT inhaler Inhale into the lungs every 6 (six) hours as needed for wheezing or shortness of breath.    Marland Kitchen albuterol (PROVENTIL) (5 MG/ML) 0.5% nebulizer solution Take 2.5 mg by nebulization every 6 (six) hours as needed for wheezing or shortness of breath.    . diazepam (VALIUM) 5 MG tablet Take 5 mg by mouth 2 (two) times daily as needed for anxiety.   0  . escitalopram (LEXAPRO) 20 MG tablet Take 20 mg by mouth daily.  5  . ibuprofen (ADVIL,MOTRIN) 200 MG tablet Take 1,600 mg by mouth every 6 (six) hours as needed for moderate pain.    . Multiple Vitamins-Minerals (MULTIVITAMIN ADULTS PO) Take 1 tablet by mouth daily.    Marland Kitchen zonisamide (ZONEGRAN) 100 MG capsule Take 100 mg by mouth at bedtime.  2  . predniSONE (DELTASONE) 50 MG tablet Take 1 tablet (50 mg total) by mouth daily. (Patient not  taking: Reported on 06/24/2016) 5 tablet 0    Allergies:  Allergies  Allergen Reactions  . Methylene Blue Hives  . Contrast Media [Iodinated Diagnostic Agents] Hives    Family History  Problem Relation Age of Onset  . Hypertension Other   . Diabetes Other     Social History:  reports that she has been smoking Cigarettes.  She has never used smokeless tobacco. She reports that she drinks alcohol. She reports that she does not use drugs.  Review of Systems: negative except as above   Blood pressure 119/83, pulse 80, temperature 97.5 F (36.4 C), temperature source Oral, resp. rate 16, height _0  (1.702 m), weight 105 kg (231 lb 7.7 oz), SpO2 100 %. Head: Normocephalic, without obvious abnormality, atraumatic Neck: no adenopathy, no carotid bruit, no JVD, supple, symmetrical, trachea midline and thyroid not enlarged, symmetric, no tenderness/mass/nodules Resp: clear to auscultation bilaterally Cardio: regular rate and rhythm, S1, S2 normal, no murmur, click, rub or gallop GI: Abdomen soft moderately tender with normoactive bowel sounds no hepatomegaly masses or guarding. Extremities: extremities normal, atraumatic, no cyanosis or edema  Results for orders placed or performed during the hospital encounter of 06/24/16 (from the past 48 hour(s))  Lipase, blood     Status: Abnormal   Collection Time: 06/24/16  2:37 PM  Result Value Ref Range   Lipase 371 (H) 11 - 51 U/L  Comprehensive metabolic panel  Status: Abnormal   Collection Time: 06/24/16  2:37 PM  Result Value Ref Range   Sodium 137 135 - 145 mmol/L   Potassium 3.7 3.5 - 5.1 mmol/L   Chloride 105 101 - 111 mmol/L   CO2 24 22 - 32 mmol/L   Glucose, Bld 136 (H) 65 - 99 mg/dL   BUN 5 (L) 6 - 20 mg/dL   Creatinine, Ser 0.70 0.44 - 1.00 mg/dL   Calcium 8.2 (L) 8.9 - 10.3 mg/dL   Total Protein 6.3 (L) 6.5 - 8.1 g/dL   Albumin 3.2 (L) 3.5 - 5.0 g/dL   AST 88 (H) 15 - 41 U/L   ALT 64 (H) 14 - 54 U/L   Alkaline  Phosphatase 131 (H) 38 - 126 U/L   Total Bilirubin 1.5 (H) 0.3 - 1.2 mg/dL   GFR calc non Af Amer >60 >60 mL/min   GFR calc Af Amer >60 >60 mL/min    Comment: (NOTE) The eGFR has been calculated using the CKD EPI equation. This calculation has not been validated in all clinical situations. eGFR's persistently <60 mL/min signify possible Chronic Kidney Disease.    Anion gap 8 5 - 15  CBC     Status: Abnormal   Collection Time: 06/24/16  2:37 PM  Result Value Ref Range   WBC 7.8 4.0 - 10.5 K/uL   RBC 3.32 (L) 3.87 - 5.11 MIL/uL   Hemoglobin 11.5 (L) 12.0 - 15.0 g/dL   HCT 32.6 (L) 36.0 - 46.0 %   MCV 98.2 78.0 - 100.0 fL   MCH 34.6 (H) 26.0 - 34.0 pg   MCHC 35.3 30.0 - 36.0 g/dL   RDW 14.0 11.5 - 15.5 %   Platelets 216 150 - 400 K/uL  I-Stat Beta hCG blood, ED ([facility], [facility], [facility] only)     Status: None   Collection Time: 06/24/16  4:44 PM  Result Value Ref Range   I-stat hCG, quantitative <5.0 <5 mIU/mL   Comment 3            Comment:   GEST. AGE      CONC.  (mIU/mL)   <=1 WEEK        5 - 50     2 WEEKS       50 - 500     3 WEEKS       100 - 10,000     4 WEEKS     1,000 - 30,000        FEMALE AND NON-PREGNANT FEMALE:     LESS THAN 5 mIU/mL   CBC WITH DIFFERENTIAL     Status: Abnormal   Collection Time: 06/24/16  8:50 PM  Result Value Ref Range   WBC 7.3 4.0 - 10.5 K/uL   RBC 3.27 (L) 3.87 - 5.11 MIL/uL   Hemoglobin 11.2 (L) 12.0 - 15.0 g/dL   HCT 32.2 (L) 36.0 - 46.0 %   MCV 98.5 78.0 - 100.0 fL   MCH 34.3 (H) 26.0 - 34.0 pg   MCHC 34.8 30.0 - 36.0 g/dL   RDW 13.8 11.5 - 15.5 %   Platelets 209 150 - 400 K/uL   Neutrophils Relative % 80 %   Neutro Abs 5.8 1.7 - 7.7 K/uL   Lymphocytes Relative 11 %   Lymphs Abs 0.8 0.7 - 4.0 K/uL   Monocytes Relative 9 %   Monocytes Absolute 0.6 0.1 - 1.0 K/uL   Eosinophils Relative 0 %   Eosinophils Absolute 0.0 0.0 -  0.7 K/uL   Basophils Relative 0 %   Basophils Absolute 0.0 0.0 - 0.1 K/uL  Magnesium     Status: Abnormal   Collection  Time: 06/24/16  8:50 PM  Result Value Ref Range   Magnesium 1.4 (L) 1.7 - 2.4 mg/dL  Phosphorus     Status: None   Collection Time: 06/24/16  8:50 PM  Result Value Ref Range   Phosphorus 2.8 2.5 - 4.6 mg/dL  MRSA PCR Screening     Status: None   Collection Time: 06/24/16  8:58 PM  Result Value Ref Range   MRSA by PCR NEGATIVE NEGATIVE    Comment:        The GeneXpert MRSA Assay (FDA approved for NASAL specimens only), is one component of a comprehensive MRSA colonization surveillance program. It is not intended to diagnose MRSA infection nor to guide or monitor treatment for MRSA infections.   Urinalysis, Routine w reflex microscopic     Status: Abnormal   Collection Time: 06/25/16  6:36 AM  Result Value Ref Range   Color, Urine AMBER (A) YELLOW    Comment: BIOCHEMICALS MAY BE AFFECTED BY COLOR   APPearance CLEAR CLEAR   Specific Gravity, Urine 1.023 1.005 - 1.030   pH 5.0 5.0 - 8.0   Glucose, UA NEGATIVE NEGATIVE mg/dL   Hgb urine dipstick NEGATIVE NEGATIVE   Bilirubin Urine NEGATIVE NEGATIVE   Ketones, ur 5 (A) NEGATIVE mg/dL   Protein, ur NEGATIVE NEGATIVE mg/dL   Nitrite NEGATIVE NEGATIVE   Leukocytes, UA NEGATIVE NEGATIVE  Lipase, blood     Status: Abnormal   Collection Time: 06/25/16  6:41 AM  Result Value Ref Range   Lipase 318 (H) 11 - 51 U/L  Comprehensive metabolic panel     Status: Abnormal   Collection Time: 06/25/16  6:41 AM  Result Value Ref Range   Sodium 138 135 - 145 mmol/L   Potassium 3.3 (L) 3.5 - 5.1 mmol/L   Chloride 105 101 - 111 mmol/L   CO2 24 22 - 32 mmol/L   Glucose, Bld 111 (H) 65 - 99 mg/dL   BUN <5 (L) 6 - 20 mg/dL   Creatinine, Ser 0.85 0.44 - 1.00 mg/dL   Calcium 8.3 (L) 8.9 - 10.3 mg/dL   Total Protein 6.1 (L) 6.5 - 8.1 g/dL   Albumin 3.1 (L) 3.5 - 5.0 g/dL   AST 50 (H) 15 - 41 U/L   ALT 48 14 - 54 U/L   Alkaline Phosphatase 119 38 - 126 U/L   Total Bilirubin 1.2 0.3 - 1.2 mg/dL   GFR calc non Af Amer >60 >60 mL/min   GFR calc  Af Amer >60 >60 mL/min    Comment: (NOTE) The eGFR has been calculated using the CKD EPI equation. This calculation has not been validated in all clinical situations. eGFR's persistently <60 mL/min signify possible Chronic Kidney Disease.    Anion gap 9 5 - 15  Glucose, capillary     Status: None   Collection Time: 06/25/16  7:56 AM  Result Value Ref Range   Glucose-Capillary 99 65 - 99 mg/dL   US Abdomen Limited Ruq  Result Date: 06/24/2016 CLINICAL DATA:  31 year old female with acute right upper quadrant and epigastric abdominal pain since 0400 hours. Initial encounter. EXAM: US ABDOMEN LIMITED - RIGHT UPPER QUADRANT COMPARISON:  CT Abdomen and Pelvis without contrast 07/25/2012 FINDINGS: Gallbladder: No gallstones or wall thickening visualized. No sonographic Murphy sign noted by sonographer. Common bile duct:  Diameter: 5 mm common normal Liver: Liver echogenicity is within normal limits. No liver lesion or intrahepatic biliary ductal dilatation identified. Other findings: Small volume of perihepatic free fluid including in Morison pouch between the liver and kidney (image 11). Otherwise negative visible right kidney. Mild pancreatic ductal dilatation, 3-4 mm (image 30). Probable small volume of peripancreatic fluid. IMPRESSION: 1. Small volume nonspecific perihepatic and probably also peripancreatic abdominal fluid. Consider Acute Pancreatitis and recommend follow-up CT Abdomen and Pelvis (oral and IV contrast preferred). 2. Normal gallbladder. No CBD enlargement, but possible main pancreatic duct obstruction. Electronically Signed   By: Genevie Ann M.D.   On: 06/24/2016 16:23    Assessment: Acute pancreatitis probably secondary to alcohol Plan:  Usual supportive care and hydration Will obtain MRI/MRCP Will follow with you  Hally Colella C 06/25/2016, 11:53 AM  Pager [phone] If no answer or after 5 PM call [phone]

## 2016-06-25 NOTE — Progress Notes (Signed)
Pt reports she drinks alcohol "pretty frequently."   Reminded need urine specimen.

## 2016-06-25 NOTE — Progress Notes (Signed)
Patient ID: Sara Contreras, female   DOB: 1985-12-31, 31 y.o.   MRN: [id number]  PROGRESS NOTE    Sara Contreras  WUJ:[id number] DOB: 1985/07/24 DOA: 06/24/2016  PCP: Georgianne Fick, MD   Brief Narrative:  31 y.o. female with no significant past medical history other than asthma (controlled with PRN albuterol inhaler) who presented to ED with sudden onset epigastric and right upper quadrant abdominal pain started this am prior to this admission, associated with nausea and non bloody vomiting. Pt also reported associated diarrhea. Pt reported poor po intake sure to symptoms. No blood in stool. Pt has had no fevers or chills. No chest pain, no cough. No lightheadedness or loss of consciousness.  Pt was hemodynamically stable. Abd US showed small volume nonspecific perihepatic and probably also peripancreatic abdominal fluid. Follow up CT recommended. Normal gallbladder, possible main pancreatic duct obstruction. LFT's showed AST 88, ALT 64, ALP 131, Total bili 1.5. GI consulted.   Assessment & Plan:   Active Problems:   Acute pancreatitis / Epigastric and right upper quadrant abdominal pain / Abnormal LFT's - Abd US showed small volume nonspecific perihepatic and probably also peripancreatic abdominal fluid. Follow up CT recommended. Normal gallbladder; possible main pancreatic duct obstruction - AST 88, ALT 64, ALP 131, Total bili 1.5 - Continue NPO - Continue IV fluids, antiemetics as needed and analgesia as needed  - Appreciate GI consult and recommendations   DVT prophylaxis: SCD's bilaterally  Code Status: full code  Family Communication: no family at the bedside  Disposition Plan: home once cleared by GI   Consultants:   GI, Dr. Evette Cristal  Procedures:   None   Antimicrobials:   None    Subjective: No overnight events.  Objective: Vitals:   06/24/16 2030 06/24/16 2036 06/25/16 0448 06/25/16 0633  BP:  (!) 124/91  119/83  Pulse:  88  80  Resp:  16  16  Temp:   98.3 F (36.8 C)  97.5 F (36.4 C)  TempSrc:  Oral  Oral  SpO2:  100%  100%  Weight: 105.2 kg (231 lb 14.8 oz)  105 kg (231 lb 7.7 oz)   Height:  (1.702 m)       Intake/Output Summary (Last 24 hours) at 06/25/16 0903 Last data filed at 06/25/16 0300  Gross per 24 hour  Intake           606.67 ml  Output                0 ml  Net           606.67 ml   Filed Weights   06/24/16 1423 06/24/16 2030 06/25/16 0448  Weight: 102.1 kg (225 lb) 105.2 kg (231 lb 14.8 oz) 105 kg (231 lb 7.7 oz)    Examination:  General exam: Appears calm and comfortable  Respiratory system: Clear to auscultation. Respiratory effort normal. Cardiovascular system: S1 & S2 heard, RRR. No JVD, murmurs, rubs, gallops or clicks. No pedal edema. Gastrointestinal system: Abdomen is tender in mid abdomen without guarding, (+) BS Central nervous system: Alert and oriented. No focal neurological deficits. Extremities: Symmetric 5 x 5 power. Skin: No rashes, lesions or ulcers Psychiatry: Judgement and insight appear normal. Mood & affect appropriate.   Data Reviewed: I have personally reviewed following labs and imaging studies  CBC:  Recent Labs Lab 06/24/16 1437 06/24/16 2050  WBC 7.8 7.3  NEUTROABS  --  5.8  HGB 11.5* 11.2*  HCT 32.6* 32.2*  MCV  98.2 98.5  PLT 216 209   Basic Metabolic Panel:  Recent Labs Lab 06/24/16 1437 06/24/16 2050 06/25/16 0641  NA 137  --  138  K 3.7  --  3.3*  CL 105  --  105  CO2 24  --  24  GLUCOSE 136*  --  111*  BUN 5*  --  <5*  CREATININE 0.70  --  0.85  CALCIUM 8.2*  --  8.3*  MG  --  1.4*  --   PHOS  --  2.8  --    GFR: Estimated Creatinine Clearance: 119.6 mL/min (by C-G formula based on SCr of 0.85 mg/dL). Liver Function Tests:  Recent Labs Lab 06/24/16 1437 06/25/16 0641  AST 88* 50*  ALT 64* 48  ALKPHOS 131* 119  BILITOT 1.5* 1.2  PROT 6.3* 6.1*  ALBUMIN 3.2* 3.1*    Recent Labs Lab 06/24/16 1437 06/25/16 0641  LIPASE 371* 318*    No results for input(s): AMMONIA in the last 168 hours. Coagulation Profile: No results for input(s): INR, PROTIME in the last 168 hours. Cardiac Enzymes: No results for input(s): CKTOTAL, CKMB, CKMBINDEX, TROPONINI in the last 168 hours. BNP (last 3 results) No results for input(s): PROBNP in the last 8760 hours. HbA1C: No results for input(s): HGBA1C in the last 72 hours. CBG:  Recent Labs Lab 06/25/16 0756  GLUCAP 99   Lipid Profile: No results for input(s): CHOL, HDL, LDLCALC, TRIG, CHOLHDL, LDLDIRECT in the last 72 hours. Thyroid Function Tests: No results for input(s): TSH, T4TOTAL, FREET4, T3FREE, THYROIDAB in the last 72 hours. Anemia Panel: No results for input(s): VITAMINB12, FOLATE, FERRITIN, TIBC, IRON, RETICCTPCT in the last 72 hours. Urine analysis:    Component Value Date/Time   COLORURINE AMBER (A) 06/25/2016 0636   APPEARANCEUR CLEAR 06/25/2016 0636   APPEARANCEUR Clear 07/25/2012 1828   LABSPEC 1.023 06/25/2016 0636   LABSPEC 1.005 07/25/2012 1828   PHURINE 5.0 06/25/2016 0636   GLUCOSEU NEGATIVE 06/25/2016 0636   GLUCOSEU Negative 07/25/2012 1828   HGBUR NEGATIVE 06/25/2016 0636   BILIRUBINUR NEGATIVE 06/25/2016 0636   BILIRUBINUR Negative 07/25/2012 1828   KETONESUR 5 (A) 06/25/2016 0636   PROTEINUR NEGATIVE 06/25/2016 0636   UROBILINOGEN 0.2 05/06/2013 0954   NITRITE NEGATIVE 06/25/2016 0636   LEUKOCYTESUR NEGATIVE 06/25/2016 0636   LEUKOCYTESUR Negative 07/25/2012 1828   Sepsis Labs: (procalcitonin:4,lacticidven:4)    MRSA PCR Screening     Status: None   Collection Time: 06/24/16  8:58 PM  Result Value Ref Range Status   MRSA by PCR NEGATIVE NEGATIVE Final      Radiology Studies: US Abdomen Limited Ruq 06/24/2016 1. Small volume nonspecific perihepatic and probably also peripancreatic abdominal fluid. Consider Acute Pancreatitis and recommend follow-up CT Abdomen and Pelvis (oral and IV contrast preferred). 2. Normal  gallbladder. No CBD enlargement, but possible main pancreatic duct obstruction. Electronically Signed   By: Odessa Fleming M.D.   On: 06/24/2016 16:23      Scheduled Meds: . escitalopram  20 mg Oral Daily  . sodium chloride flush  3 mL Intravenous Q12H  . zonisamide  100 mg Oral QHS   Continuous Infusions: . sodium chloride Stopped (06/25/16 0733)     LOS: 1 day    Time spent: 25 minutes  Greater than 50% of the time spent on counseling and coordinating the care.   Manson Passey, MD [facility] Pager [phone]  If 7PM-7AM, please contact night-coverage [email or web address] Password [facility] 06/25/2016, 9:03 AM

## 2016-06-26 DIAGNOSIS — K8500 Idiopathic acute pancreatitis without necrosis or infection: Secondary | ICD-10-CM

## 2016-06-26 LAB — LIPASE, BLOOD: Lipase: 161 U/L — ABNORMAL HIGH (ref 11–51)

## 2016-06-26 LAB — GLUCOSE, CAPILLARY
GLUCOSE-CAPILLARY: 65 mg/dL (ref 65–99)
GLUCOSE-CAPILLARY: 79 mg/dL (ref 65–99)

## 2016-06-26 MED ORDER — POTASSIUM CHLORIDE CRYS ER 20 MEQ PO TBCR
40.0000 meq | EXTENDED_RELEASE_TABLET | Freq: Once | ORAL | Status: AC
  Administered 2016-06-26: 40 meq via ORAL
  Filled 2016-06-26: qty 2

## 2016-06-26 MED ORDER — DEXTROSE 50 % IV SOLN
25.0000 mL | Freq: Once | INTRAVENOUS | Status: AC
  Administered 2016-06-26: 25 mL via INTRAVENOUS
  Filled 2016-06-26: qty 50

## 2016-06-26 NOTE — Progress Notes (Signed)
[facility] Progress Note  Subjective: Pain slightly better this morning, no new complaints. MRCP done, report pending  Objective: Vital signs in last 24 hours: Temp:  [98.2 F (36.8 C)-98.7 F (37.1 C)] 98.2 F (36.8 C) (04/13 0455) Pulse Rate:  [64-72] 71 (04/13 0455) Resp:  [16-18] 16 (04/13 0455) BP: (113-129)/(76-98) 113/76 (04/13 0455) SpO2:  [99 %-100 %] 99 % (04/13 0455) Weight:  [110.8 kg (244 lb 4.3 oz)] 110.8 kg (244 lb 4.3 oz) (04/13 0455) Weight change: 8.741 kg (19 lb 4.3 oz)   PE: Unchanged  Lab Results: Results for orders placed or performed during the hospital encounter of 06/24/16 (from the past 24 hour(s))  Glucose, capillary     Status: None   Collection Time: 06/25/16  7:56 AM  Result Value Ref Range   Glucose-Capillary 99 65 - 99 mg/dL    Studies/Results: US Abdomen Limited Ruq  Result Date: 06/24/2016 CLINICAL DATA:  31 year old female with acute right upper quadrant and epigastric abdominal pain since 0400 hours. Initial encounter. EXAM: US ABDOMEN LIMITED - RIGHT UPPER QUADRANT COMPARISON:  CT Abdomen and Pelvis without contrast 07/25/2012 FINDINGS: Gallbladder: No gallstones or wall thickening visualized. No sonographic Murphy sign noted by sonographer. Common bile duct: Diameter: 5 mm common normal Liver: Liver echogenicity is within normal limits. No liver lesion or intrahepatic biliary ductal dilatation identified. Other findings: Small volume of perihepatic free fluid including in Morison pouch between the liver and kidney (image 11). Otherwise negative visible right kidney. Mild pancreatic ductal dilatation, 3-4 mm (image 30). Probable small volume of peripancreatic fluid. IMPRESSION: 1. Small volume nonspecific perihepatic and probably also peripancreatic abdominal fluid. Consider Acute Pancreatitis and recommend follow-up CT Abdomen and Pelvis (oral and IV contrast preferred). 2. Normal gallbladder. No CBD enlargement, but possible main  pancreatic duct obstruction. Electronically Signed   By: Odessa Fleming M.D.   On: 06/24/2016 16:23      Assessment: Pancreatitis, likely secondary to alcohol  Plan: Supportive care and alcohol cessation Await MRI, MRCP to rule out an hepatobiliary source Advance diet slowly as tolerated.    Fay Swider C 06/26/2016, 6:56 AM  Pager [phone] If no answer or after 5 PM call [phone]

## 2016-06-26 NOTE — Progress Notes (Signed)
Hypoglycemic Event  CBG: 65  Treatment: D50 IV 25 mL  Symptoms: None  Follow-up CBG: Time:0825 CBG Result:79  Possible Reasons for Event: Inadequate meal intake  Comments/MD notified:MD made aware    Sara Contreras L

## 2016-06-26 NOTE — Progress Notes (Signed)
Patient ID: Sara Contreras, female   DOB: Nov 08, 1985, 31 y.o.   MRN: [id number]  PROGRESS NOTE    Sara Contreras  WUJ:[id number] DOB: Jan 25, 1986 DOA: 06/24/2016  PCP: Georgianne Fick, MD   Brief Narrative:  31 y.o. female with no significant past medical history other than asthma (controlled with PRN albuterol inhaler) who presented to ED with sudden onset epigastric and right upper quadrant abdominal pain started this am prior to this admission, associated with nausea and non bloody vomiting. Pt also reported associated diarrhea. Pt reported poor po intake sure to symptoms. No blood in stool. Pt has had no fevers or chills. No chest pain, no cough. No lightheadedness or loss of consciousness.  Pt was hemodynamically stable. Abd US showed small volume nonspecific perihepatic and probably also peripancreatic abdominal fluid. Follow up CT recommended. Normal gallbladder, possible main pancreatic duct obstruction. LFT's showed AST 88, ALT 64, ALP 131, Total bili 1.5. GI consulted.   Assessment & Plan:   Active Problems:   Acute pancreatitis / Epigastric and right upper quadrant abdominal pain / Abnormal LFT's - Abd US showed small volume nonspecific perihepatic and probably also peripancreatic abdominal fluid. Follow up CT recommended. Normal gallbladder; possible main pancreatic duct obstruction - AST 88, ALT 64, ALP 131, Total bili 1.5 - MRCP done, pending - Appreciate GI recommendations, okay to advance to clears per GI if pt tolerates   DVT prophylaxis: SCD's bilaterally  Code Status: full code  Family Communication: no family at the bedside  Disposition Plan: home once cleared by GI   Consultants:   GI, Dr. Evette Cristal  Procedures:   None   Antimicrobials:   None    Subjective: No overnight events.  Objective: Vitals:   06/25/16 2203 06/26/16 0455 06/26/16 0800 06/26/16 1422  BP: 129/77 113/76 115/80 115/71  Pulse: 72 71 70 72  Resp: Temp: 98.7 F (37.1  C) 98.2 F (36.8 C) 98.1 F (36.7 C) 98.1 F (36.7 C)  TempSrc: Oral Oral Oral Oral  SpO2: 100% 99% 100% 99%  Weight:  110.8 kg (244 lb 4.3 oz)    Height:        Intake/Output Summary (Last 24 hours) at 06/26/16 1554 Last data filed at 06/26/16 1000  Gross per 24 hour  Intake                0 ml  Output               25 ml  Net              -25 ml   Filed Weights   06/24/16 2030 06/25/16 0448 06/26/16 0455  Weight: 105.2 kg (231 lb 14.8 oz) 105 kg (231 lb 7.7 oz) 110.8 kg (244 lb 4.3 oz)    Examination:  General exam: Appears calm and comfortable, no distress  Respiratory system: No wheezing, no rhonchi . Cardiovascular system: S1 & S2 heard, Rate controlled  Gastrointestinal system: (+) BS, non tender  Central nervous system: No focal neurological deficits. Extremities: Symmetric 5 x 5 power. No swelling  Skin: No rashes, lesions or ulcers, skin is warm and dry Psychiatry: Normal mood and behavior   Data Reviewed: I have personally reviewed following labs and imaging studies  CBC:  Recent Labs Lab 06/24/16 1437 06/24/16 2050  WBC 7.8 7.3  NEUTROABS  --  5.8  HGB 11.5* 11.2*  HCT 32.6* 32.2*  MCV 98.2 98.5  PLT 216 209   Basic  Metabolic Panel:  Recent Labs Lab 06/24/16 1437 06/24/16 2050 06/25/16 0641  NA 137  --  138  K 3.7  --  3.3*  CL 105  --  105  CO2 24  --  24  GLUCOSE 136*  --  111*  BUN 5*  --  <5*  CREATININE 0.70  --  0.85  CALCIUM 8.2*  --  8.3*  MG  --  1.4*  --   PHOS  --  2.8  --    GFR: Estimated Creatinine Clearance: 123.1 mL/min (by C-G formula based on SCr of 0.85 mg/dL). Liver Function Tests:  Recent Labs Lab 06/24/16 1437 06/25/16 0641  AST 88* 50*  ALT 64* 48  ALKPHOS 131* 119  BILITOT 1.5* 1.2  PROT 6.3* 6.1*  ALBUMIN 3.2* 3.1*    Recent Labs Lab 06/24/16 1437 06/25/16 0641 06/26/16 0915  LIPASE 371* 318* 161*   No results for input(s): AMMONIA in the last 168 hours. Coagulation Profile: No results for  input(s): INR, PROTIME in the last 168 hours. Cardiac Enzymes: No results for input(s): CKTOTAL, CKMB, CKMBINDEX, TROPONINI in the last 168 hours. BNP (last 3 results) No results for input(s): PROBNP in the last 8760 hours. HbA1C: No results for input(s): HGBA1C in the last 72 hours. CBG:  Recent Labs Lab 06/25/16 0756 06/26/16 0723 06/26/16 0826  GLUCAP 99 65 79   Lipid Profile: No results for input(s): CHOL, HDL, LDLCALC, TRIG, CHOLHDL, LDLDIRECT in the last 72 hours. Thyroid Function Tests: No results for input(s): TSH, T4TOTAL, FREET4, T3FREE, THYROIDAB in the last 72 hours. Anemia Panel: No results for input(s): VITAMINB12, FOLATE, FERRITIN, TIBC, IRON, RETICCTPCT in the last 72 hours. Urine analysis:    Component Value Date/Time   COLORURINE AMBER (A) 06/25/2016 0636   APPEARANCEUR CLEAR 06/25/2016 0636   APPEARANCEUR Clear 07/25/2012 1828   LABSPEC 1.023 06/25/2016 0636   LABSPEC 1.005 07/25/2012 1828   PHURINE 5.0 06/25/2016 0636   GLUCOSEU NEGATIVE 06/25/2016 0636   GLUCOSEU Negative 07/25/2012 1828   HGBUR NEGATIVE 06/25/2016 0636   BILIRUBINUR NEGATIVE 06/25/2016 0636   BILIRUBINUR Negative 07/25/2012 1828   KETONESUR 5 (A) 06/25/2016 0636   PROTEINUR NEGATIVE 06/25/2016 0636   UROBILINOGEN 0.2 05/06/2013 0954   NITRITE NEGATIVE 06/25/2016 0636   LEUKOCYTESUR NEGATIVE 06/25/2016 0636   LEUKOCYTESUR Negative 07/25/2012 1828   Sepsis Labs: (procalcitonin:4,lacticidven:4)    MRSA PCR Screening     Status: None   Collection Time: 06/24/16  8:58 PM  Result Value Ref Range Status   MRSA by PCR NEGATIVE NEGATIVE Final      Radiology Studies: US Abdomen Limited Ruq 06/24/2016 1. Small volume nonspecific perihepatic and probably also peripancreatic abdominal fluid. Consider Acute Pancreatitis and recommend follow-up CT Abdomen and Pelvis (oral and IV contrast preferred). 2. Normal gallbladder. No CBD enlargement, but possible main pancreatic duct  obstruction. Electronically Signed   By: Odessa Fleming M.D.   On: 06/24/2016 16:23      Scheduled Meds: . escitalopram  20 mg Oral Daily  . sodium chloride flush  3 mL Intravenous Q12H  . zonisamide  100 mg Oral QHS   Continuous Infusions: . sodium chloride 50 mL/hr at 06/26/16 0850     LOS: 2 days    Time spent: 15 minutes  Greater than 50% of the time spent on counseling and coordinating the care.   Manson Passey, MD [facility] Pager [phone]  If 7PM-7AM, please contact night-coverage [email or web address] Password [facility] 06/26/2016, 3:54 PM

## 2016-06-27 DIAGNOSIS — R945 Abnormal results of liver function studies: Secondary | ICD-10-CM

## 2016-06-27 LAB — BASIC METABOLIC PANEL
Anion gap: 4 — ABNORMAL LOW (ref 5–15)
BUN: <5 mg/dL — ABNORMAL LOW (ref 6–20)
CALCIUM: 8.2 mg/dL — AB (ref 8.9–10.3)
CO2: 26 mmol/L (ref 22–32)
CREATININE: 0.64 mg/dL (ref 0.44–1.00)
Chloride: 107 mmol/L (ref 101–111)
GFR calc Af Amer: >60 mL/min (ref >60)
GFR calc non Af Amer: >60 mL/min (ref >60)
GLUCOSE: 72 mg/dL (ref 65–99)
Potassium: 3.3 mmol/L — ABNORMAL LOW (ref 3.5–5.1)
Sodium: 137 mmol/L (ref 135–145)

## 2016-06-27 LAB — CBC
HCT: 26.3 % — ABNORMAL LOW (ref 36.0–46.0)
HEMOGLOBIN: 8.7 g/dL — AB (ref 12.0–15.0)
MCH: 33.1 pg (ref 26.0–34.0)
MCHC: 33.1 g/dL (ref 30.0–36.0)
MCV: 100 fL (ref 78.0–100.0)
PLATELETS: 153 10*3/uL (ref 150–400)
RBC: 2.63 MIL/uL — ABNORMAL LOW (ref 3.87–5.11)
RDW: 14.5 % (ref 11.5–15.5)
WBC: 5.8 10*3/uL (ref 4.0–10.5)

## 2016-06-27 LAB — GLUCOSE, CAPILLARY
GLUCOSE-CAPILLARY: 60 mg/dL — AB (ref 65–99)
Glucose-Capillary: 77 mg/dL (ref 65–99)

## 2016-06-27 MED ORDER — HYDROCODONE-ACETAMINOPHEN 5-325 MG PO TABS
1.0000 | ORAL_TABLET | Freq: Four times a day (QID) | ORAL | Status: DC | PRN
  Administered 2016-06-27 (×2): 1 via ORAL
  Filled 2016-06-27 (×2): qty 1

## 2016-06-27 MED ORDER — POTASSIUM CHLORIDE CRYS ER 20 MEQ PO TBCR
40.0000 meq | EXTENDED_RELEASE_TABLET | Freq: Once | ORAL | Status: AC
Start: 2016-06-27 — End: 2016-06-27
  Administered 2016-06-27: 40 meq via ORAL
  Filled 2016-06-27: qty 2

## 2016-06-27 NOTE — Progress Notes (Signed)
Subjective: Abdominal pain improving.  Objective: Vital signs in last 24 hours: Temp:  [98.1 F (36.7 C)-98.7 F (37.1 C)] 98.3 F (36.8 C) (04/14 0458) Pulse Rate:  [72-98] 98 (04/14 0458) Resp:  [20] 20 (04/14 0458) BP: (110-119)/(70-71) 119/70 (04/14 0458) SpO2:  [99 %-100 %] 100 % (04/14 0458) Weight change:  Last BM Date: 06/24/16  PE: GEN:  Overweight, NAD ABD:  Mild distended, mild epigastric tenderness, hypoactive bowel sounds  Lab Results: CBC    Component Value Date/Time   WBC 5.8 06/27/2016 0736   RBC 2.63 (L) 06/27/2016 0736   HGB 8.7 (L) 06/27/2016 0736   HGB 12.3 07/25/2012 1828   HCT 26.3 (L) 06/27/2016 0736   HCT 36.6 07/25/2012 1828   PLT 153 06/27/2016 0736   PLT 320 07/25/2012 1828   MCV 100.0 06/27/2016 0736   MCV 85 07/25/2012 1828   MCH 33.1 06/27/2016 0736   MCHC 33.1 06/27/2016 0736   RDW 14.5 06/27/2016 0736   RDW 15.7 (H) 07/25/2012 1828   LYMPHSABS 0.8 06/24/2016 2050   MONOABS 0.6 06/24/2016 2050   EOSABS 0.0 06/24/2016 2050   BASOSABS 0.0 06/24/2016 2050   CMP     Component Value Date/Time   NA 137 06/27/2016 0736   NA 134 (L) 07/25/2012 1828   K 3.3 (L) 06/27/2016 0736   K 3.7 07/25/2012 1828   CL 107 06/27/2016 0736   CL 102 07/25/2012 1828   CO2 26 06/27/2016 0736   CO2 28 07/25/2012 1828   GLUCOSE 72 06/27/2016 0736   GLUCOSE 86 07/25/2012 1828   BUN <5 (L) 06/27/2016 0736   BUN 14 07/25/2012 1828   CREATININE 0.64 06/27/2016 0736   CREATININE 0.89 07/25/2012 1828   CALCIUM 8.2 (L) 06/27/2016 0736   CALCIUM 9.2 07/25/2012 1828   PROT 6.1 (L) 06/25/2016 0641   PROT 8.2 07/25/2012 1828   ALBUMIN 3.1 (L) 06/25/2016 0641   ALBUMIN 3.6 07/25/2012 1828   AST 50 (H) 06/25/2016 0641   AST 27 07/25/2012 1828   ALT 48 06/25/2016 0641   ALT 26 07/25/2012 1828   ALKPHOS 119 06/25/2016 0641   ALKPHOS 72 07/25/2012 1828   BILITOT 1.2 06/25/2016 0641   BILITOT 0.5 07/25/2012 1828   GFRNONAA >60 06/27/2016 0736   GFRNONAA >60  07/25/2012 1828   GFRAA >60 06/27/2016 0736   GFRAA >60 07/25/2012 1828   Assessment:  1.  Pancreatitis, likely alcohol-mediated, complicated by suspected pancreatic ascites.  Improving. 2.  Pancreas divisum.  Common anatomic variant; in this case, doubt it's related to her pancreatitis. 3.  Abdominal pain, likely from #1 above.  Plan:  1.  Advance to bland low fat diet. 2.  Saline lock IV fluids. 3.  Transition to oral analgesics, and use sparingly. 4.  Mobilize, ambulate halls, OOBTC. 5.  Hope for discharge home tomorrow. 6.  [facility] will follow.   Freddy Jaksch 06/27/2016, 8:37 AM   Pager [phone] If no answer or after 5 PM call [phone]

## 2016-06-27 NOTE — Progress Notes (Addendum)
Patient ID: Sara Contreras, female   DOB: 1985/12/29, 31 y.o.   MRN: [id number]  PROGRESS NOTE    Sara Contreras  JXB:[id number] DOB: 09-18-1985 DOA: 06/24/2016  PCP: Georgianne Fick, MD   Brief Narrative:  31 y.o. female with no significant past medical history other than asthma (controlled with PRN albuterol inhaler) who presented to ED with sudden onset epigastric and right upper quadrant abdominal pain started this am prior to this admission, associated with nausea and non bloody vomiting. Pt also reported associated diarrhea. Pt reported poor po intake sure to symptoms. No blood in stool. Pt has had no fevers or chills. No chest pain, no cough. No lightheadedness or loss of consciousness.  Pt was hemodynamically stable. Abd US showed small volume nonspecific perihepatic and probably also peripancreatic abdominal fluid. Follow up CT recommended. Normal gallbladder, possible main pancreatic duct obstruction. LFT's showed AST 88, ALT 64, ALP 131, Total bili 1.5. GI consulted.   Assessment & Plan:   Active Problems:   Acute pancreatitis / Epigastric and right upper quadrant abdominal pain / Abnormal LFT's - Abd US showed small volume nonspecific perihepatic and probably also peripancreatic abdominal fluid. Follow up CT recommended. Normal gallbladder; possible main pancreatic duct obstruction - AST 88, ALT 64, ALP 131, Total bili 1.5 - MRCP done, no obstruction; pancreas divisum. Common anatomic variant; in this case, doubt it's related to her pancreatitis. - Advance diet today to soft, hoep to go home tomorrow     Hypokalemia - Due to GI related issues - Supplemented - Follow up BMP in am  DVT prophylaxis: SCD's bilaterally  Code Status: full code  Family Communication: family at the bedside  Disposition Plan: home in am   Consultants:   GI, Dr. Evette Cristal  Procedures:   None   Antimicrobials:   None    Subjective: No overnight events.  Objective: Vitals:   06/26/16 1422 06/26/16 2048 06/27/16 0458 06/27/16 1353  BP: 115/71 110/71 119/70 118/76  Pulse: 72 73 98 69  Resp: Temp: 98.1 F (36.7 C) 98.7 F (37.1 C) 98.3 F (36.8 C) 98.7 F (37.1 C)  TempSrc: Oral Oral Oral Oral  SpO2: 99% 100% 100% 100%  Weight:      Height:        Intake/Output Summary (Last 24 hours) at 06/27/16 1459 Last data filed at 06/27/16 0752  Gross per 24 hour  Intake              240 ml  Output                0 ml  Net              240 ml   Filed Weights   06/24/16 2030 06/25/16 0448 06/26/16 0455  Weight: 105.2 kg (231 lb 14.8 oz) 105 kg (231 lb 7.7 oz) 110.8 kg (244 lb 4.3 oz)    Examination:  General exam: no distress  Respiratory system: no rhonchi, bilateral air entry  Cardiovascular system: S1 & S2 heard, Rate controlled  Gastrointestinal system: (+) BS, no distention  Central nervous system: Nonfocal Extremities: no edema, palpable pulses  Skin: warm and dry Psychiatry: Normal mood  Data Reviewed: I have personally reviewed following labs and imaging studies  CBC:  Recent Labs Lab 06/24/16 1437 06/24/16 2050 06/27/16 0736  WBC 7.8 7.3 5.8  NEUTROABS  --  5.8  --   HGB 11.5* 11.2* 8.7*  HCT 32.6* 32.2* 26.3*  MCV  98.2 98.5 100.0  PLT 216 209 153   Basic Metabolic Panel:  Recent Labs Lab 06/24/16 1437 06/24/16 2050 06/25/16 0641 06/27/16 0736  NA 137  --  138 137  K 3.7  --  3.3* 3.3*  CL 105  --  105 107  CO2 24  --  24 26  GLUCOSE 136*  --  111* 72  BUN 5*  --  <5* <5*  CREATININE 0.70  --  0.85 0.64  CALCIUM 8.2*  --  8.3* 8.2*  MG  --  1.4*  --   --   PHOS  --  2.8  --   --    GFR: Estimated Creatinine Clearance: 130.8 mL/min (by C-G formula based on SCr of 0.64 mg/dL). Liver Function Tests:  Recent Labs Lab 06/24/16 1437 06/25/16 0641  AST 88* 50*  ALT 64* 48  ALKPHOS 131* 119  BILITOT 1.5* 1.2  PROT 6.3* 6.1*  ALBUMIN 3.2* 3.1*    Recent Labs Lab 06/24/16 1437 06/25/16 0641  06/26/16 0915  LIPASE 371* 318* 161*   No results for input(s): AMMONIA in the last 168 hours. Coagulation Profile: No results for input(s): INR, PROTIME in the last 168 hours. Cardiac Enzymes: No results for input(s): CKTOTAL, CKMB, CKMBINDEX, TROPONINI in the last 168 hours. BNP (last 3 results) No results for input(s): PROBNP in the last 8760 hours. HbA1C: No results for input(s): HGBA1C in the last 72 hours. CBG:  Recent Labs Lab 06/25/16 0756 06/26/16 0723 06/26/16 0826 06/27/16 0744 06/27/16 0838  GLUCAP 99 65 79 60* 77   Lipid Profile: No results for input(s): CHOL, HDL, LDLCALC, TRIG, CHOLHDL, LDLDIRECT in the last 72 hours. Thyroid Function Tests: No results for input(s): TSH, T4TOTAL, FREET4, T3FREE, THYROIDAB in the last 72 hours. Anemia Panel: No results for input(s): VITAMINB12, FOLATE, FERRITIN, TIBC, IRON, RETICCTPCT in the last 72 hours. Urine analysis:    Component Value Date/Time   COLORURINE AMBER (A) 06/25/2016 0636   APPEARANCEUR CLEAR 06/25/2016 0636   APPEARANCEUR Clear 07/25/2012 1828   LABSPEC 1.023 06/25/2016 0636   LABSPEC 1.005 07/25/2012 1828   PHURINE 5.0 06/25/2016 0636   GLUCOSEU NEGATIVE 06/25/2016 0636   GLUCOSEU Negative 07/25/2012 1828   HGBUR NEGATIVE 06/25/2016 0636   BILIRUBINUR NEGATIVE 06/25/2016 0636   BILIRUBINUR Negative 07/25/2012 1828   KETONESUR 5 (A) 06/25/2016 0636   PROTEINUR NEGATIVE 06/25/2016 0636   UROBILINOGEN 0.2 05/06/2013 0954   NITRITE NEGATIVE 06/25/2016 0636   LEUKOCYTESUR NEGATIVE 06/25/2016 0636   LEUKOCYTESUR Negative 07/25/2012 1828   Sepsis Labs: (procalcitonin:4,lacticidven:4)    MRSA PCR Screening     Status: None   Collection Time: 06/24/16  8:58 PM  Result Value Ref Range Status   MRSA by PCR NEGATIVE NEGATIVE Final      Radiology Studies: US Abdomen Limited Ruq 06/24/2016 1. Small volume nonspecific perihepatic and probably also peripancreatic abdominal fluid. Consider  Acute Pancreatitis and recommend follow-up CT Abdomen and Pelvis (oral and IV contrast preferred). 2. Normal gallbladder. No CBD enlargement, but possible main pancreatic duct obstruction. Electronically Signed   By: Odessa Fleming M.D.   On: 06/24/2016 16:23      Scheduled Meds: . escitalopram  20 mg Oral Daily  . sodium chloride flush  3 mL Intravenous Q12H  . zonisamide  100 mg Oral QHS   Continuous Infusions: . sodium chloride 50 mL/hr at 06/26/16 0850     LOS: 3 days    Time spent: 15 minutes  Greater than  50% of the time spent on counseling and coordinating the care.   Manson Passey, MD [facility] Pager [phone]  If 7PM-7AM, please contact night-coverage [email or web address] Password [facility] 06/27/2016, 2:59 PM

## 2016-06-28 LAB — BASIC METABOLIC PANEL
ANION GAP: 7 (ref 5–15)
BUN: <5 mg/dL — ABNORMAL LOW (ref 6–20)
CALCIUM: 8.4 mg/dL — AB (ref 8.9–10.3)
CO2: 25 mmol/L (ref 22–32)
Chloride: 106 mmol/L (ref 101–111)
Creatinine, Ser: 0.63 mg/dL (ref 0.44–1.00)
GFR calc Af Amer: >60 mL/min (ref >60)
GFR calc non Af Amer: >60 mL/min (ref >60)
Glucose, Bld: 68 mg/dL (ref 65–99)
Potassium: 3.2 mmol/L — ABNORMAL LOW (ref 3.5–5.1)
Sodium: 138 mmol/L (ref 135–145)

## 2016-06-28 LAB — GLUCOSE, CAPILLARY
GLUCOSE-CAPILLARY: 85 mg/dL (ref 65–99)
Glucose-Capillary: 57 mg/dL — ABNORMAL LOW (ref 65–99)

## 2016-06-28 MED ORDER — POTASSIUM CHLORIDE ER 10 MEQ PO TBCR: 10.0000 meq | EXTENDED_RELEASE_TABLET | Freq: Every day | ORAL | 0 refills | Status: DC

## 2016-06-28 MED ORDER — TRAMADOL HCL 50 MG PO TABS: 50.0000 mg | ORAL_TABLET | Freq: Four times a day (QID) | ORAL | 0 refills | Status: DC | PRN

## 2016-06-28 MED ORDER — POTASSIUM CHLORIDE CRYS ER 20 MEQ PO TBCR
40.0000 meq | EXTENDED_RELEASE_TABLET | Freq: Once | ORAL | Status: AC
  Administered 2016-06-28: 40 meq via ORAL
  Filled 2016-06-28: qty 2

## 2016-06-28 NOTE — Discharge Summary (Signed)
Physician Discharge Summary  Sara Contreras NWG:[id number] DOB: [birth year] DOA: 06/24/2016  PCP: Georgianne Fick, MD  Admit date: 06/24/2016 Discharge date: 06/28/2016  Recommendations for Outpatient Follow-up:  Take potassium for 7 more days on discharge  Discharge Diagnoses:  Active Problems:   Acute pancreatitis   Abnormal LFTs    Discharge Condition: stable   Diet recommendation: as tolerated   History of present illness:  31 y.o.femalewith no significant past medical history other than asthma (controlled with PRN albuterol inhaler) who presented to ED with sudden onset epigastric and right upper quadrant abdominal pain started this am prior to this admission, associated with nausea and non bloody vomiting. Pt also reported associated diarrhea. Pt reported poor po intake sure to symptoms. No blood in stool. Pt has had no fevers or chills. No chest pain, no cough. No lightheadedness or loss of consciousness.  Pt was hemodynamically stable. Abd US showed small volume nonspecific perihepatic and probably also peripancreatic abdominal fluid. Follow up CT recommended. Normal gallbladder, possible main pancreatic duct obstruction. LFT's showed AST 88, ALT 64, ALP 131, Total bili 1.5. GI consulted.  Hospital Course:  Active Problems: Acute pancreatitis / Epigastric and right upper quadrant abdominal pain / Abnormal LFT's - Abd US showed small volume nonspecific perihepatic and probably also peripancreatic abdominal fluid. Follow up CT recommended. Normal gallbladder; possible main pancreatic duct obstruction - AST 88, ALT 64, ALP 131, Total bili 1.5 - MRCP done, no obstruction; pancreas divisum. Common anatomic variant; in this case, doubt it's related to her pancreatitis. - Tolerates soft diet     Hypokalemia - Due to GI related issues - Supplemented - Will continue supplementation fo r7 days on discharge    DVT prophylaxis: SCD's bilaterally  Code Status: full code   Family Communication: family at the bedside     Consultants:   GI, Dr. Evette Cristal  Procedures:   None   Antimicrobials:   None     Signed:  Manson Passey, MD  [facility] 06/28/2016, 9:53 AM  Pager #: [phone]  Time spent in minutes: less than 30 minutes    Discharge Exam: Vitals:   06/27/16 2227 06/28/16 0648  BP: 123/66 (!) 137/92  Pulse: 65 67  Resp: 18 18  Temp: 98.6 F (37 C) 98.3 F (36.8 C)   Vitals:   06/27/16 0458 06/27/16 1353 06/27/16 2227 06/28/16 0648  BP: 119/70 118/76 123/66 (!) 137/92  Pulse: 98 69 65 67  Resp: Temp: 98.3 F (36.8 C) 98.7 F (37.1 C) 98.6 F (37 C) 98.3 F (36.8 C)  TempSrc: Oral Oral Oral Oral  SpO2: 100% 100% 99% 100%  Weight:      Height:        General: Pt is alert, follows commands appropriately, not in acute distress Cardiovascular: Regular rate and rhythm, S1/S2 +, no murmurs Respiratory: Clear to auscultation bilaterally, no wheezing, no crackles, no rhonchi Abdominal: Soft, non tender, non distended, bowel sounds +, no guarding Extremities: no edema, no cyanosis, pulses palpable bilaterally DP and PT Neuro: Grossly nonfocal  Discharge Instructions  Discharge Instructions    Call MD for:  persistant nausea and vomiting    Complete by:  As directed    Call MD for:  redness, tenderness, or signs of infection (pain, swelling, redness, odor or green/yellow discharge around incision site)    Complete by:  As directed    Call MD for:  severe uncontrolled pain    Complete by:  As directed  Diet - low sodium heart healthy    Complete by:  As directed    Discharge instructions    Complete by:  As directed    Take potassium for 7 more days on discharge   Increase activity slowly    Complete by:  As directed      Allergies as of 06/28/2016      Reactions   Methylene Blue Hives   Contrast Media [iodinated Diagnostic Agents] Hives      Medication List    STOP taking these  medications   ibuprofen 200 MG tablet Commonly known as:  ADVIL,MOTRIN   predniSONE 50 MG tablet Commonly known as:  DELTASONE     TAKE these medications   acetaminophen 500 MG tablet Commonly known as:  TYLENOL Take 1,500 mg by mouth every 6 (six) hours as needed for moderate pain.   albuterol 108 (90 Base) MCG/ACT inhaler Commonly known as:  PROVENTIL HFA;VENTOLIN HFA Inhale into the lungs every 6 (six) hours as needed for wheezing or shortness of breath.   albuterol (5 MG/ML) 0.5% nebulizer solution Commonly known as:  PROVENTIL Take 2.5 mg by nebulization every 6 (six) hours as needed for wheezing or shortness of breath.   diazepam 5 MG tablet Commonly known as:  VALIUM Take 5 mg by mouth 2 (two) times daily as needed for anxiety.   escitalopram 20 MG tablet Commonly known as:  LEXAPRO Take 20 mg by mouth daily.   MULTIVITAMIN ADULTS PO Take 1 tablet by mouth daily.   potassium chloride 10 MEQ tablet Commonly known as:  K-DUR Take 1 tablet (10 mEq total) by mouth daily.   traMADol 50 MG tablet Commonly known as:  ULTRAM Take 1 tablet (50 mg total) by mouth every 6 (six) hours as needed.   zonisamide 100 MG capsule Commonly known as:  ZONEGRAN Take 100 mg by mouth at bedtime.      Follow-up Information    RAMACHANDRAN,AJITH, MD. Schedule an appointment as soon as possible for a visit in 2 week(s).   Specialty:  Internal Medicine Contact information: [address] [place] [address] [phone]            The results of significant diagnostics from this hospitalization (including imaging, microbiology, ancillary and laboratory) are listed below for reference.    Significant Diagnostic Studies: Mr 3d Recon At Scanner  Result Date: 06/26/2016 CLINICAL DATA:  Abdominal pain starting yesterday. Gastric bypass in 2016. Abnormal abdominal ultrasound. EXAM: MRI ABDOMEN WITHOUT AND WITH CONTRAST (INCLUDING MRCP) TECHNIQUE: Multiplanar  multisequence MR imaging of the abdomen was performed both before and after the administration of intravenous contrast. Heavily T2-weighted images of the biliary and pancreatic ducts were obtained, and three-dimensional MRCP images were rendered by post processing. CONTRAST:  20mL MULTIHANCE GADOBENATE DIMEGLUMINE 529 MG/ML IV SOLN COMPARISON:  Ultrasound of the abdomen of 1 day prior. CT 07/25/2012. FINDINGS: Portions of exam are minimally motion degraded. Lower chest: Normal heart size. Small left pleural effusion. Left base atelectasis. Hepatobiliary: Tiny right hepatic lobe cyst. No suspicious liver lesion. Normal gallbladder. No biliary duct dilatation. Common duct is somewhat difficult to follow secondary to pancreatic edema. No choledocholithiasis. Pancreas: Moderate diffuse pancreatic and peripancreatic edema. No well-defined peripancreatic fluid collection. No pancreatic necrosis. No pancreatic duct dilatation. Suspicion of pancreas divisum, as evidenced by prominent dorsal duct entering the duodenum on image 63/ series 08657. Spleen:  Normal in size, without focal abnormality. Adrenals/Urinary Tract: Normal adrenal glands. Normal kidneys, without hydronephrosis.  Stomach/Bowel: Proximal gastric underdistention. Normal abdominal bowel loops. Vascular/Lymphatic: Normal caliber of the aorta. Patent portal and splenic veins. Mildly prominent porta hepatis nodes are likely reactive. Other:  Moderate ascites. Musculoskeletal: No acute osseous abnormality. IMPRESSION: 1. Moderate, non complicated pancreatitis. Probable pancreas divisum. 2. Normal gallbladder and biliary tree. 3. Moderate volume ascites with small left pleural effusion. Electronically Signed   By: Jeronimo Greaves M.D.   On: 06/26/2016 10:24   Mr Abdomen Mrcp Vivien Rossetti Contast  Result Date: 06/26/2016 CLINICAL DATA:  Abdominal pain starting yesterday. Gastric bypass in 2016. Abnormal abdominal ultrasound. EXAM: MRI ABDOMEN WITHOUT AND WITH CONTRAST  (INCLUDING MRCP) TECHNIQUE: Multiplanar multisequence MR imaging of the abdomen was performed both before and after the administration of intravenous contrast. Heavily T2-weighted images of the biliary and pancreatic ducts were obtained, and three-dimensional MRCP images were rendered by post processing. CONTRAST:  20mL MULTIHANCE GADOBENATE DIMEGLUMINE 529 MG/ML IV SOLN COMPARISON:  Ultrasound of the abdomen of 1 day prior. CT 07/25/2012. FINDINGS: Portions of exam are minimally motion degraded. Lower chest: Normal heart size. Small left pleural effusion. Left base atelectasis. Hepatobiliary: Tiny right hepatic lobe cyst. No suspicious liver lesion. Normal gallbladder. No biliary duct dilatation. Common duct is somewhat difficult to follow secondary to pancreatic edema. No choledocholithiasis. Pancreas: Moderate diffuse pancreatic and peripancreatic edema. No well-defined peripancreatic fluid collection. No pancreatic necrosis. No pancreatic duct dilatation. Suspicion of pancreas divisum, as evidenced by prominent dorsal duct entering the duodenum on image 63/ series 38466. Spleen:  Normal in size, without focal abnormality. Adrenals/Urinary Tract: Normal adrenal glands. Normal kidneys, without hydronephrosis. Stomach/Bowel: Proximal gastric underdistention. Normal abdominal bowel loops. Vascular/Lymphatic: Normal caliber of the aorta. Patent portal and splenic veins. Mildly prominent porta hepatis nodes are likely reactive. Other:  Moderate ascites. Musculoskeletal: No acute osseous abnormality. IMPRESSION: 1. Moderate, non complicated pancreatitis. Probable pancreas divisum. 2. Normal gallbladder and biliary tree. 3. Moderate volume ascites with small left pleural effusion. Electronically Signed   By: Jeronimo Greaves M.D.   On: 06/26/2016 10:24   US Abdomen Limited Ruq  Result Date: 06/24/2016 CLINICAL DATA:  31 year old female with acute right upper quadrant and epigastric abdominal pain since 0400 hours.  Initial encounter. EXAM: US ABDOMEN LIMITED - RIGHT UPPER QUADRANT COMPARISON:  CT Abdomen and Pelvis without contrast 07/25/2012 FINDINGS: Gallbladder: No gallstones or wall thickening visualized. No sonographic Murphy sign noted by sonographer. Common bile duct: Diameter: 5 mm common normal Liver: Liver echogenicity is within normal limits. No liver lesion or intrahepatic biliary ductal dilatation identified. Other findings: Small volume of perihepatic free fluid including in Morison pouch between the liver and kidney (image 11). Otherwise negative visible right kidney. Mild pancreatic ductal dilatation, 3-4 mm (image 30). Probable small volume of peripancreatic fluid. IMPRESSION: 1. Small volume nonspecific perihepatic and probably also peripancreatic abdominal fluid. Consider Acute Pancreatitis and recommend follow-up CT Abdomen and Pelvis (oral and IV contrast preferred). 2. Normal gallbladder. No CBD enlargement, but possible main pancreatic duct obstruction. Electronically Signed   By: Odessa Fleming M.D.   On: 06/24/2016 16:23    Microbiology: Recent Results (from the past 240 hour(s))  MRSA PCR Screening     Status: None   Collection Time: 06/24/16  8:58 PM  Result Value Ref Range Status   MRSA by PCR NEGATIVE NEGATIVE Final    Comment:        The GeneXpert MRSA Assay (FDA approved for NASAL specimens only), is one component of a comprehensive MRSA colonization surveillance program. It  is not intended to diagnose MRSA infection nor to guide or monitor treatment for MRSA infections.      Labs: Basic Metabolic Panel:  Recent Labs Lab 06/24/16 1437 06/24/16 2050 06/25/16 0641 06/27/16 0736 06/28/16 0619  NA 137  --  138 137 138  K 3.7  --  3.3* 3.3* 3.2*  CL 105  --  105 107 106  CO2 24  --  GLUCOSE 136*  --  111* 72 68  BUN 5*  --  <5* <5* <5*  CREATININE 0.70  --  0.85 0.64 0.63  CALCIUM 8.2*  --  8.3* 8.2* 8.4*  MG  --  1.4*  --   --   --   PHOS  --  2.8  --   --    --    Liver Function Tests:  Recent Labs Lab 06/24/16 1437 06/25/16 0641  AST 88* 50*  ALT 64* 48  ALKPHOS 131* 119  BILITOT 1.5* 1.2  PROT 6.3* 6.1*  ALBUMIN 3.2* 3.1*    Recent Labs Lab 06/24/16 1437 06/25/16 0641 06/26/16 0915  LIPASE 371* 318* 161*   No results for input(s): AMMONIA in the last 168 hours. CBC:  Recent Labs Lab 06/24/16 1437 06/24/16 2050 06/27/16 0736  WBC 7.8 7.3 5.8  NEUTROABS  --  5.8  --   HGB 11.5* 11.2* 8.7*  HCT 32.6* 32.2* 26.3*  MCV 98.2 98.5 100.0  PLT 216 209 153   Cardiac Enzymes: No results for input(s): CKTOTAL, CKMB, CKMBINDEX, TROPONINI in the last 168 hours. BNP: BNP (last 3 results) No results for input(s): BNP in the last 8760 hours.  ProBNP (last 3 results) No results for input(s): PROBNP in the last 8760 hours.  CBG:  Recent Labs Lab 06/26/16 0826 06/27/16 0744 06/27/16 0838 06/28/16 0853 06/28/16 0934  GLUCAP 79 60* 77 57* 85

## 2016-06-28 NOTE — Progress Notes (Signed)
Subjective: Abdominal pain improving. Had temporary set back after eating cheeseburger (!!!) yesterday.  Objective: Vital signs in last 24 hours: Temp:  [98.3 F (36.8 C)-98.7 F (37.1 C)] 98.3 F (36.8 C) (04/15 0648) Pulse Rate:  [65-69] 67 (04/15 0648) Resp:  [18] 18 (04/15 0648) BP: (118-137)/(66-92) 137/92 (04/15 0648) SpO2:  [99 %-100 %] 100 % (04/15 0648) Weight:  [106.5 kg (234 lb 11.2 oz)] 106.5 kg (234 lb 11.2 oz) (04/15 1009) Weight change:  Last BM Date: 06/27/16  PE: GEN:  Overweight, NAD ABD:  Protuberant, soft, mild epigastric and left upper quadrant tenderness  Lab Results: CBC    Component Value Date/Time   WBC 5.8 06/27/2016 0736   RBC 2.63 (L) 06/27/2016 0736   HGB 8.7 (L) 06/27/2016 0736   HGB 12.3 07/25/2012 1828   HCT 26.3 (L) 06/27/2016 0736   HCT 36.6 07/25/2012 1828   PLT 153 06/27/2016 0736   PLT 320 07/25/2012 1828   MCV 100.0 06/27/2016 0736   MCV 85 07/25/2012 1828   MCH 33.1 06/27/2016 0736   MCHC 33.1 06/27/2016 0736   RDW 14.5 06/27/2016 0736   RDW 15.7 (H) 07/25/2012 1828   LYMPHSABS 0.8 06/24/2016 2050   MONOABS 0.6 06/24/2016 2050   EOSABS 0.0 06/24/2016 2050   BASOSABS 0.0 06/24/2016 2050   CMP     Component Value Date/Time   NA 138 06/28/2016 0619   NA 134 (L) 07/25/2012 1828   K 3.2 (L) 06/28/2016 0619   K 3.7 07/25/2012 1828   CL 106 06/28/2016 0619   CL 102 07/25/2012 1828   CO2 25 06/28/2016 0619   CO2 28 07/25/2012 1828   GLUCOSE 68 06/28/2016 0619   GLUCOSE 86 07/25/2012 1828   BUN <5 (L) 06/28/2016 0619   BUN 14 07/25/2012 1828   CREATININE 0.63 06/28/2016 0619   CREATININE 0.89 07/25/2012 1828   CALCIUM 8.4 (L) 06/28/2016 0619   CALCIUM 9.2 07/25/2012 1828   PROT 6.1 (L) 06/25/2016 0641   PROT 8.2 07/25/2012 1828   ALBUMIN 3.1 (L) 06/25/2016 0641   ALBUMIN 3.6 07/25/2012 1828   AST 50 (H) 06/25/2016 0641   AST 27 07/25/2012 1828   ALT 48 06/25/2016 0641   ALT 26 07/25/2012 1828   ALKPHOS 119  06/25/2016 0641   ALKPHOS 72 07/25/2012 1828   BILITOT 1.2 06/25/2016 0641   BILITOT 0.5 07/25/2012 1828   GFRNONAA >60 06/28/2016 0619   GFRNONAA >60 07/25/2012 1828   GFRAA >60 06/28/2016 0619   GFRAA >60 07/25/2012 1828   Assessment:  1.  Pancreatitis, likely alcohol-mediated, complicated by suspected pancreatic ascites.  Improving. 2.  Pancreas divisum.  Common anatomic variant; in this case, doubt it's related to her pancreatitis. 3.  Abdominal pain, likely from #1 above. 4.  Anemia.  Likely hemodilutional (from IV fluids) and pancreatitis.  No overt GI bleeding.  Plan:  1.  Bland low fat diet only (no cheeseburgers or the like) for the next couple weeks, gradually advance thereafter as tolerated. 2.  No alcohol.  Counseled that patient will likely have recurrent problems with pancreatitis if she should resume alcohol consumption. 3.  OK to discharge home today from GI perspective; patient can follow-up with [place] on as-needed basis, and/or at discretion of her primary physician. 4.  [facility] will sign-off; please call with questions; thank you for the consultation.   Sara Contreras 06/28/2016, 11:20 AM   Pager [phone] If no answer or after 5 PM call [phone]

## 2016-06-28 NOTE — Discharge Instructions (Signed)
Acute Pancreatitis ° °Acute pancreatitis is a condition in which the pancreas suddenly becomes irritated and swollen (has inflammation). The pancreas is a gland that is located behind the stomach. It produces enzymes that help to digest food. The pancreas also releases the hormones glucagon and insulin, which help to regulate blood sugar. Damage to the pancreas occurs when the digestive enzymes from the pancreas are activated before they are released into the intestine. °Most acute attacks last a couple of days and can cause serious problems. Some people become dehydrated and develop low blood pressure. In severe cases, bleeding into the pancreas can lead to shock and can be life-threatening. The lungs, heart, and kidneys may fail. °What are the causes? °The most common causes of this condition are: °· Alcohol abuse. °· Gallstones. °Other causes include: °· Certain medicines. °· Exposure to certain chemicals. °· Infection. °· Damage caused by an accident (trauma). °· Abdominal surgery. °In some cases, the cause may not be known. °What are the signs or symptoms? °Symptoms of this condition include: °· Pain in the upper abdomen that may radiate to the back. °· Tenderness and swelling of the abdomen. °· Nausea and vomiting. °How is this diagnosed? °This condition may be diagnosed based on: °· A physical exam. °· Blood tests. °· Imaging tests, such as X-rays, CT scans, or an ultrasound of the abdomen. °How is this treated? °Treatment for this condition usually requires a stay in the hospital. Treatment may include: °· Pain medicine. °· Fluid replacement through an IV tube. °· Placing a tube in the stomach to remove stomach contents and to control vomiting (NG tube, or nasogastric tube). °· Not eating for 3-4 days. This gives the pancreas a rest, because enzymes are not being produced that can cause further damage. °· Antibiotic medicines, if your condition is caused by an infection. °· Surgery on the pancreas or  gallbladder. °Follow these instructions at home: °Eating and drinking  °· Follow instructions from your health care provider about diet. This may involve avoiding alcohol and decreasing the amount of fat in your diet. °· Eat smaller, more frequent meals. This reduces the amount of digestive fluids that the pancreas produces. °· Drink enough fluid to keep your urine clear or pale yellow. °· Do not drink alcohol if it caused your condition. °General instructions  °· Take over-the-counter and prescription medicines only as told by your health care provider. °· Do not use any tobacco products, such as cigarettes, chewing tobacco, and e-cigarettes. If you need help quitting, ask your health care provider. °· Get plenty of rest. °· If directed, check your blood sugar at home as told by your health care provider. °· Keep all follow-up visits as told by your health care provider. This is important. °Contact a health care provider if: °· You do not recover as quickly as expected. °· You develop new or worsening symptoms. °· You have persistent pain, weakness, or nausea. °· You recover and then have another episode of pain. °· You have a fever. °Get help right away if: °· You cannot eat or keep fluids down. °· Your pain becomes severe. °· Your skin or the white part of your eyes turns yellow (jaundice). °· You vomit. °· You feel dizzy or you faint. °· Your blood sugar is high (over 300 mg/dL). °This information is not intended to replace advice given to you by your health care provider. Make sure you discuss any questions you have with your health care provider. °Document Released: 03/02/2005 Document   Revised: 07/10/2015 Document Reviewed: 12/04/2014 °Elsevier Interactive Patient Education © 2017 [employer] ° °

## 2016-11-26 ENCOUNTER — Encounter (HOSPITAL_COMMUNITY): Payer: Self-pay | Admitting: Emergency Medicine

## 2016-11-26 ENCOUNTER — Emergency Department (HOSPITAL_COMMUNITY)
Admission: EM | Admit: 2016-11-26 | Discharge: 2016-11-26 | Disposition: A | Discharge status: Home / Self Care | Payer: BLUE CROSS/BLUE SHIELD | Attending: Emergency Medicine | Admitting: Emergency Medicine

## 2016-11-26 DIAGNOSIS — Z79899 Other long term (current) drug therapy: Secondary | ICD-10-CM | POA: Insufficient documentation

## 2016-11-26 DIAGNOSIS — F17210 Nicotine dependence, cigarettes, uncomplicated: Secondary | ICD-10-CM | POA: Insufficient documentation

## 2016-11-26 DIAGNOSIS — R1013 Epigastric pain: Secondary | ICD-10-CM

## 2016-11-26 DIAGNOSIS — J45909 Unspecified asthma, uncomplicated: Secondary | ICD-10-CM | POA: Diagnosis not present

## 2016-11-26 LAB — DIFFERENTIAL
Basophils Absolute: 0 10*3/uL (ref 0.0–0.1)
Basophils Relative: 0 %
EOS ABS: 0.1 10*3/uL (ref 0.0–0.7)
EOS PCT: 2 %
LYMPHS ABS: 2.1 10*3/uL (ref 0.7–4.0)
LYMPHS PCT: 54 %
MONOS PCT: 9 %
Monocytes Absolute: 0.3 10*3/uL (ref 0.1–1.0)
Neutro Abs: 1.4 10*3/uL — ABNORMAL LOW (ref 1.7–7.7)
Neutrophils Relative %: 35 %

## 2016-11-26 LAB — URINALYSIS, ROUTINE W REFLEX MICROSCOPIC
Bilirubin Urine: NEGATIVE
GLUCOSE, UA: NEGATIVE mg/dL
HGB URINE DIPSTICK: NEGATIVE
KETONES UR: NEGATIVE mg/dL
LEUKOCYTES UA: NEGATIVE
Nitrite: NEGATIVE
PH: 6 (ref 5.0–8.0)
PROTEIN: NEGATIVE mg/dL
Specific Gravity, Urine: 1.019 (ref 1.005–1.030)

## 2016-11-26 LAB — CBC
HCT: 31.7 % — ABNORMAL LOW (ref 36.0–46.0)
Hemoglobin: 10.9 g/dL — ABNORMAL LOW (ref 12.0–15.0)
MCH: 31.1 pg (ref 26.0–34.0)
MCHC: 34.4 g/dL (ref 30.0–36.0)
MCV: 90.3 fL (ref 78.0–100.0)
PLATELETS: 184 10*3/uL (ref 150–400)
RBC: 3.51 MIL/uL — ABNORMAL LOW (ref 3.87–5.11)
RDW: 18 % — AB (ref 11.5–15.5)
WBC: 4 10*3/uL (ref 4.0–10.5)

## 2016-11-26 LAB — COMPREHENSIVE METABOLIC PANEL
ALBUMIN: 3.4 g/dL — AB (ref 3.5–5.0)
ALT: 27 U/L (ref 14–54)
AST: 48 U/L — AB (ref 15–41)
Alkaline Phosphatase: 73 U/L (ref 38–126)
Anion gap: 8 (ref 5–15)
BUN: 8 mg/dL (ref 6–20)
CHLORIDE: 100 mmol/L — AB (ref 101–111)
CO2: 27 mmol/L (ref 22–32)
CREATININE: 0.81 mg/dL (ref 0.44–1.00)
Calcium: 8.5 mg/dL — ABNORMAL LOW (ref 8.9–10.3)
GFR calc Af Amer: >60 mL/min (ref >60)
GLUCOSE: 105 mg/dL — AB (ref 65–99)
Potassium: 3.5 mmol/L (ref 3.5–5.1)
Sodium: 135 mmol/L (ref 135–145)
Total Bilirubin: 1.2 mg/dL (ref 0.3–1.2)
Total Protein: 6.7 g/dL (ref 6.5–8.1)

## 2016-11-26 LAB — LIPASE, BLOOD: LIPASE: 102 U/L — AB (ref 11–51)

## 2016-11-26 LAB — I-STAT BETA HCG BLOOD, ED (MC, WL, AP ONLY): I-stat hCG, quantitative: <5 m[IU]/mL (ref <5)

## 2016-11-26 LAB — CBG MONITORING, ED: Glucose-Capillary: 117 mg/dL — ABNORMAL HIGH (ref 65–99)

## 2016-11-26 MED ORDER — SODIUM CHLORIDE 0.9 % IV BOLUS (SEPSIS)
1000.0000 mL | Freq: Once | INTRAVENOUS | Status: AC
  Administered 2016-11-26: 1000 mL via INTRAVENOUS

## 2016-11-26 MED ORDER — ONDANSETRON HCL 4 MG/2ML IJ SOLN
4.0000 mg | Freq: Once | INTRAMUSCULAR | Status: AC
  Administered 2016-11-26: 4 mg via INTRAVENOUS
  Filled 2016-11-26: qty 2

## 2016-11-26 MED ORDER — MORPHINE SULFATE 15 MG PO TABS: 15.0000 mg | ORAL_TABLET | Freq: Four times a day (QID) | ORAL | 0 refills | Status: DC | PRN

## 2016-11-26 MED ORDER — MORPHINE SULFATE (PF) 4 MG/ML IV SOLN
4.0000 mg | Freq: Once | INTRAVENOUS | Status: AC
  Administered 2016-11-26: 4 mg via INTRAVENOUS
  Filled 2016-11-26: qty 1

## 2016-11-26 MED ORDER — ONDANSETRON 4 MG PO TBDP: 4.0000 mg | ORAL_TABLET | Freq: Three times a day (TID) | ORAL | 0 refills | Status: AC | PRN

## 2016-11-26 MED ORDER — MORPHINE SULFATE (PF) 4 MG/ML IV SOLN
4.0000 mg | INTRAVENOUS | Status: DC | PRN
  Administered 2016-11-26 (×2): 4 mg via INTRAVENOUS
  Filled 2016-11-26 (×2): qty 1

## 2016-11-26 NOTE — ED Triage Notes (Signed)
Pt brought in by EMS with c/o right side abd pain that radiates into her back and into her epigastric area  Pt states the pain started on Wednesday and has progressively gotten worse  Pt states she has had nausea, vomiting, and diarrhea  Pt states she has been taking only clear liquids  Pt states she was seen for same in April and was diagnosed with acute pancreatitis

## 2016-11-26 NOTE — Discharge Instructions (Signed)
Take Zofran every 8 hours for nausea and vomiting. Advance diet slowly, starting with clear liquids for the next 2 days, then you may move on to bland solid foods such as plain chicken, crackers, and mashed potatoes. You may take morphine tablets for pain. You may also use Tylenol, but do not exceed 4000 g of Tylenol in one day. Follow-up with your primary care physician for reevaluation in the next 2-3 days. Return to the ED immediately if any concerning signs or symptoms develop such as fever, inability to keep any liquids down, or worsening symptoms

## 2016-11-26 NOTE — ED Notes (Signed)
Pt given graham crackers and ice water 

## 2016-11-26 NOTE — ED Provider Notes (Signed)
[facility]-EMERGENCY DEPT Provider Note   CSN: [id number] Arrival date & time: 11/26/16  0152     History   Chief Complaint Chief Complaint  Patient presents with  . Abdominal Pain    HPI Sara Contreras is a 31 y.o. female History of anxiety, asthma, gastric ulcers, and acute pancreatitis who presents today with chief complaint acute onset, progressively worsening abdominal pain for 2 days. Pain is stabbing and aching, has become constant, and is localized to the epigastric region radiating to the right upper quadrant and to the back. Pain worsens with palpation and after eating. Has not been alleviated by anything. Patient states she has not been able to keep any food or drink down for the past 2 days. States her urine has been very dark but she thinks this is due to dehydration. Endorses multiple episodes of nonbloody but bilious emesis daily, nonbloody watery loose stools, and persistent nausea. Last meal that she was able to tolerate was [place] chicken and broccoli. She states this feels very similar to the last time she had pancreatitis which required hospitalization. She does state that this past weekend she did alcohol to celebrate her birthday and states that "I have really been trying not to drink alcohol so I think my body was not used to it ". She does not have a history of diabetes. Endorses chills but denies fevers, CP, SOB, melena, hematochezia, melena, urinary symptoms, or vaginal symptoms.  The history is provided by the patient.    Past Medical History:  Diagnosis Date  . Abnormal EKG   . Anxiety   . Asthma   . Gastric ulcer   . Migraines   . Morbid obesity [facility](H[facility])     Patient Active Problem List   Diagnosis Date Noted  . Abnormal LFTs   . Acute pancreatitis 06/24/2016    Past Surgical History:  Procedure Laterality Date  . LAPAROSCOPIC GASTRIC SLEEVE RESECTION  2016    OB History    Gravida Para Term Preterm AB Living   1 1 1          SAB TAB Ectopic Multiple  Live Births                   Home Medications    Prior to Admission medications   Medication Sig Start Date End Date Taking? Authorizing Provider  albuterol (PROVENTIL HFA;VENTOLIN HFA) 108 (90 BASE) MCG/ACT inhaler Inhale 1-2 puffs into the lungs every 6 (six) hours as needed for wheezing or shortness of breath.    Yes [provider]  albuterol (PROVENTIL) (5 MG/ML) 0.5% nebulizer solution Take 2.5 mg by nebulization every 6 (six) hours as needed for wheezing or shortness of breath.   Yes [provider]  BIOTIN PO Take 1 tablet by mouth daily.   Yes [provider]  diazepam (VALIUM) 5 MG tablet Take 5 mg by mouth 2 (two) times daily as needed for anxiety.  04/24/16  Yes [provider]  escitalopram (LEXAPRO) 20 MG tablet Take 20 mg by mouth daily. 06/19/16  Yes [provider]  Multiple Vitamins-Minerals (MULTIVITAMIN ADULTS PO) Take 1 tablet by mouth daily.   Yes [provider]  pantoprazole (PROTONIX) 40 MG tablet Take 40 mg by mouth daily. 11/12/16  Yes [provider]  zonisamide (ZONEGRAN) 100 MG capsule Take 100 mg by mouth daily as needed (for migraines).  06/19/16  Yes [provider]  morphine (MSIR) 15 MG tablet Take 1 tablet (15 mg total)  by mouth every 6 (six) hours as needed for severe pain. 11/26/16   Luevenia Maxin, Fannye Myer A, PA-C  ondansetron (ZOFRAN ODT) 4 MG disintegrating tablet Take 1 tablet (4 mg total) by mouth every 8 (eight) hours as needed for nausea or vomiting. 11/26/16 11/29/16  Michela Pitcher A, PA-C  potassium chloride (K-DUR) 10 MEQ tablet Take 1 tablet (10 mEq total) by mouth daily. Patient not taking: Reported on 11/26/2016 06/28/16   Alison Murray, MD  traMADol (ULTRAM) 50 MG tablet Take 1 tablet (50 mg total) by mouth every 6 (six) hours as needed. Patient not taking: Reported on 11/26/2016 06/28/16   Alison Murray, MD    Family History Family History  Problem Relation Age of Onset  . Hypertension  Other   . Diabetes Other     Social History Social History  Substance Use Topics  . Smoking status: Current Some Day Smoker    Types: Cigarettes  . Smokeless tobacco: Never Used     Comment: In process of quitting smoking; smokes 2 cigarettes per day  . Alcohol use Yes     Comment: occassional     Allergies   Methylene blue and Contrast media [iodinated diagnostic agents]   Review of Systems Review of Systems  Constitutional: Positive for chills. Negative for fever.  Respiratory: Negative for shortness of breath.   Cardiovascular: Negative for chest pain.  Gastrointestinal: Positive for abdominal pain, diarrhea, nausea and vomiting.  Genitourinary: Negative for decreased urine volume, dysuria, hematuria, vaginal bleeding, vaginal discharge and vaginal pain.  Musculoskeletal: Positive for back pain.  Neurological: Positive for light-headedness. Negative for syncope.  All other systems reviewed and are negative.    Physical Exam Updated Vital Signs BP (!) 130/101 (BP Location: Right Arm)   Pulse 70   Temp 98.3 F (36.8 C) (Oral)   Resp 16   Ht 5' 7.5" (1.715 m)   Wt 101.6 kg (224 lb)   LMP 11/14/2016 (Exact Date)   SpO2 100%   BMI 34.57 kg/m   Physical Exam  Constitutional: She appears well-developed and well-nourished. No distress.  HENT:  Head: Normocephalic and atraumatic.  Eyes: Conjunctivae are normal. Right eye exhibits no discharge. Left eye exhibits no discharge.  Neck: Normal range of motion. Neck supple. No JVD present. No tracheal deviation present.  Cardiovascular: Normal rate, regular rhythm, normal heart sounds and intact distal pulses.   Pulmonary/Chest: Effort normal and breath sounds normal.  Abdominal: Soft. She exhibits no distension. There is tenderness.  Maximally tender in the epigastric region with right upper quadrant and right lower quadrant TTP. Murphy sign absent, Rovsing sign absent, no TTP at McBurney's point, no CVA tenderness    Musculoskeletal: She exhibits no edema.  No midline spine TTP, right paralumbar muscle tenderness noted. No deformity, crepitus, or step-off noted  Neurological: She is alert.  Fluent speech, no facial droop  Skin: Skin is warm and dry. She is not diaphoretic. No erythema.  Psychiatric: She has a normal mood and affect. Her behavior is normal.  Nursing note and vitals reviewed.    ED Treatments / Results  Labs (all labs ordered are listed, but only abnormal results are displayed) Labs Reviewed  LIPASE, BLOOD - Abnormal; Notable for the following:       Result Value   Lipase 102 (*)    All other components within normal limits  COMPREHENSIVE METABOLIC PANEL - Abnormal; Notable for the following:    Chloride 100 (*)    Glucose, Bld 105 (*)  Calcium 8.5 (*)    Albumin 3.4 (*)    AST 48 (*)    All other components within normal limits  CBC - Abnormal; Notable for the following:    RBC 3.51 (*)    Hemoglobin 10.9 (*)    HCT 31.7 (*)    RDW 18.0 (*)    All other components within normal limits  URINALYSIS, ROUTINE W REFLEX MICROSCOPIC - Abnormal; Notable for the following:    APPearance HAZY (*)    All other components within normal limits  DIFFERENTIAL - Abnormal; Notable for the following:    Neutro Abs 1.4 (*)    All other components within normal limits  CBG MONITORING, ED - Abnormal; Notable for the following:    Glucose-Capillary 117 (*)    All other components within normal limits  I-STAT BETA HCG BLOOD, ED ([facility], [facility], [facility] ONLY)    EKG  EKG Interpretation None       Radiology No results found.  Procedures Procedures (including critical care time)  Medications Ordered in ED Medications  morphine 4 MG/ML injection 4 mg (4 mg Intravenous Given 11/26/16 1139)  sodium chloride 0.9 % bolus 1,000 mL (0 mLs Intravenous Stopped 11/26/16 0827)  ondansetron (ZOFRAN) injection 4 mg (4 mg Intravenous Given 11/26/16 0407)  sodium chloride 0.9 % bolus 1,000 mL (0 mLs  Intravenous Stopped 11/26/16 1030)  ondansetron (ZOFRAN) injection 4 mg (4 mg Intravenous Given 11/26/16 0828)  morphine 4 MG/ML injection 4 mg (4 mg Intravenous Given 11/26/16 0828)     Initial Impression / Assessment and Plan / ED Course  I have reviewed the triage vital signs and the nursing notes.  Pertinent labs & imaging results that were available during my care of the patient were reviewed by me and considered in my medical decision making (see chart for details).  Clinical Course as of Nov 26 1521  Thu Nov 26, 2016  1914 Per nursing, patient requested graham crackers. On reevaluation, patient states that she did not tolerate graham crackers but her pain and nausea have improved. Encourage patient to sip on clear fluids, and will reevaluate after this.  [MF]    Clinical Course User Index [MF] Jeanie Sewer, PA-C    Patient with epigastric pain, nausea, vomiting, and diarrhea. Afebrile, vital signs are stable, and she is nontoxic in appearance. States it feels similar to the last time she had pancreatitis but not quite as severe. Lipase is elevated at 102, but she has no leukocytosis or significant electrolyte abnormalities. Workup is consistent with dehydration from persistent vomiting. Is from dehydration and diarrhea. Doubt nephrolithiasis, UTI, appendicitis, colitis. Pain managed while in the ED. She was unable to tolerate crackers, but was able to tolerate clear fluids without difficulty. Repeat abdominal examination similar but with significant improvement in pain. She is stable for discharge home with small amount of pain medication, Zofran for nausea and strict instructions for advancement of diet. She will follow-up with her primary care physician in the next 2-3 days for reevaluation. Discussed indications for return to the ED. Pt verbalized understanding of and agreement with plan and is safe for discharge home at this time.  Final Clinical Impressions(s) / ED Diagnoses   Final  diagnoses:  Epigastric pain    New Prescriptions Discharge Medication List as of 11/26/2016 11:16 AM    START taking these medications   Details  morphine (MSIR) 15 MG tablet Take 1 tablet (15 mg total) by mouth every 6 (six) hours as  needed for severe pain., Starting Thu 11/26/2016, Print    ondansetron (ZOFRAN ODT) 4 MG disintegrating tablet Take 1 tablet (4 mg total) by mouth every 8 (eight) hours as needed for nausea or vomiting., Starting Thu 11/26/2016, Until Sun 11/29/2016, Print         Luevenia Maxin, [place] A, PA-C 11/26/16 1526    Melene Plan, DO 11/26/16 1607

## 2017-02-20 ENCOUNTER — Encounter (HOSPITAL_COMMUNITY): Payer: Self-pay | Admitting: Emergency Medicine

## 2017-02-20 ENCOUNTER — Emergency Department (HOSPITAL_COMMUNITY)
Admission: EM | Admit: 2017-02-20 | Discharge: 2017-02-20 | Disposition: A | Discharge status: Home / Self Care | Payer: BLUE CROSS/BLUE SHIELD | Attending: Emergency Medicine | Admitting: Emergency Medicine

## 2017-02-20 ENCOUNTER — Emergency Department (HOSPITAL_COMMUNITY): Payer: BLUE CROSS/BLUE SHIELD

## 2017-02-20 DIAGNOSIS — J45909 Unspecified asthma, uncomplicated: Secondary | ICD-10-CM | POA: Insufficient documentation

## 2017-02-20 DIAGNOSIS — Z79899 Other long term (current) drug therapy: Secondary | ICD-10-CM | POA: Insufficient documentation

## 2017-02-20 DIAGNOSIS — K859 Acute pancreatitis without necrosis or infection, unspecified: Secondary | ICD-10-CM | POA: Diagnosis not present

## 2017-02-20 DIAGNOSIS — R1013 Epigastric pain: Secondary | ICD-10-CM | POA: Diagnosis present

## 2017-02-20 DIAGNOSIS — F17210 Nicotine dependence, cigarettes, uncomplicated: Secondary | ICD-10-CM | POA: Diagnosis not present

## 2017-02-20 HISTORY — DX: Acute pancreatitis without necrosis or infection, unspecified: K85.90

## 2017-02-20 LAB — CBC
HEMATOCRIT: 30 % — AB (ref 36.0–46.0)
Hemoglobin: 10.2 g/dL — ABNORMAL LOW (ref 12.0–15.0)
MCH: 31.9 pg (ref 26.0–34.0)
MCHC: 34 g/dL (ref 30.0–36.0)
MCV: 93.8 fL (ref 78.0–100.0)
PLATELETS: 175 10*3/uL (ref 150–400)
RBC: 3.2 MIL/uL — AB (ref 3.87–5.11)
RDW: 17.7 % — AB (ref 11.5–15.5)
WBC: 4 10*3/uL (ref 4.0–10.5)

## 2017-02-20 LAB — COMPREHENSIVE METABOLIC PANEL
ALT: 18 U/L (ref 14–54)
AST: 31 U/L (ref 15–41)
Albumin: 3.5 g/dL (ref 3.5–5.0)
Alkaline Phosphatase: 62 U/L (ref 38–126)
Anion gap: 9 (ref 5–15)
BILIRUBIN TOTAL: 0.8 mg/dL (ref 0.3–1.2)
BUN: 9 mg/dL (ref 6–20)
CHLORIDE: 100 mmol/L — AB (ref 101–111)
CO2: 25 mmol/L (ref 22–32)
CREATININE: 0.78 mg/dL (ref 0.44–1.00)
Calcium: 8.8 mg/dL — ABNORMAL LOW (ref 8.9–10.3)
Glucose, Bld: 89 mg/dL (ref 65–99)
POTASSIUM: 3.3 mmol/L — AB (ref 3.5–5.1)
Sodium: 134 mmol/L — ABNORMAL LOW (ref 135–145)
TOTAL PROTEIN: 6.7 g/dL (ref 6.5–8.1)

## 2017-02-20 LAB — LIPASE, BLOOD: LIPASE: 62 U/L — AB (ref 11–51)

## 2017-02-20 LAB — AMYLASE: AMYLASE: 87 U/L (ref 28–100)

## 2017-02-20 LAB — I-STAT BETA HCG BLOOD, ED (MC, WL, AP ONLY): I-stat hCG, quantitative: <5 m[IU]/mL (ref <5)

## 2017-02-20 MED ORDER — HYDROMORPHONE HCL 1 MG/ML IJ SOLN
0.5000 mg | Freq: Once | INTRAMUSCULAR | Status: AC
  Administered 2017-02-20: 0.5 mg via INTRAVENOUS
  Filled 2017-02-20: qty 1

## 2017-02-20 MED ORDER — IOPAMIDOL (ISOVUE-300) INJECTION 61%
15.0000 mL | Freq: Once | INTRAVENOUS | Status: AC | PRN
  Administered 2017-02-20: 15 mL via ORAL

## 2017-02-20 MED ORDER — SODIUM CHLORIDE 0.9 % IV BOLUS (SEPSIS)
1000.0000 mL | Freq: Once | INTRAVENOUS | Status: AC
  Administered 2017-02-20: 1000 mL via INTRAVENOUS

## 2017-02-20 MED ORDER — DIPHENHYDRAMINE HCL 50 MG/ML IJ SOLN
25.0000 mg | Freq: Once | INTRAMUSCULAR | Status: AC
  Administered 2017-02-20: 25 mg via INTRAVENOUS
  Filled 2017-02-20: qty 1

## 2017-02-20 MED ORDER — GI COCKTAIL ~~LOC~~
30.0000 mL | Freq: Once | ORAL | Status: AC
  Administered 2017-02-20: 30 mL via ORAL
  Filled 2017-02-20: qty 30

## 2017-02-20 MED ORDER — ONDANSETRON HCL 4 MG/2ML IJ SOLN
4.0000 mg | Freq: Once | INTRAMUSCULAR | Status: AC
  Administered 2017-02-20: 4 mg via INTRAVENOUS
  Filled 2017-02-20: qty 2

## 2017-02-20 MED ORDER — OXYCODONE-ACETAMINOPHEN 5-325 MG PO TABS
1.0000 | ORAL_TABLET | Freq: Once | ORAL | Status: AC
  Administered 2017-02-20: 1 via ORAL
  Filled 2017-02-20: qty 1

## 2017-02-20 MED ORDER — HYDROCODONE-ACETAMINOPHEN 5-325 MG PO TABS: 1.0000 | ORAL_TABLET | ORAL | 0 refills | Status: DC | PRN

## 2017-02-20 MED ORDER — ONDANSETRON 4 MG PO TBDP
4.0000 mg | ORAL_TABLET | Freq: Once | ORAL | Status: AC | PRN
  Administered 2017-02-20: 4 mg via ORAL
  Filled 2017-02-20: qty 1

## 2017-02-20 MED ORDER — ONDANSETRON 4 MG PO TBDP: 4.0000 mg | ORAL_TABLET | Freq: Three times a day (TID) | ORAL | 0 refills | Status: DC | PRN

## 2017-02-20 MED ORDER — HYDROMORPHONE HCL 1 MG/ML IJ SOLN
1.0000 mg | Freq: Once | INTRAMUSCULAR | Status: AC
  Administered 2017-02-20: 1 mg via INTRAVENOUS
  Filled 2017-02-20: qty 1

## 2017-02-20 MED ORDER — IOPAMIDOL (ISOVUE-300) INJECTION 61%
INTRAVENOUS | Status: AC
  Filled 2017-02-20: qty 30

## 2017-02-20 MED ORDER — POTASSIUM CHLORIDE CRYS ER 20 MEQ PO TBCR
40.0000 meq | EXTENDED_RELEASE_TABLET | Freq: Once | ORAL | Status: AC
  Administered 2017-02-20: 40 meq via ORAL
  Filled 2017-02-20: qty 2

## 2017-02-20 NOTE — ED Notes (Signed)
Patient transported to CT 

## 2017-02-20 NOTE — ED Notes (Signed)
Pt is unable to give a urine sample but pt is aware one is needed.

## 2017-02-20 NOTE — Discharge Instructions (Signed)
He did have signs of pancreatitis.  Take the pain medicine as needed.  May take NSAIDs as well.  Take Zofran for nausea.  Clear liquid diet and advance as tolerated.  Drink plenty of fluids.  Follow-up with a GI doctor.  Return to ED with any worsening pain or worsening vomiting or fevers.  Your potassium is slightly low.  Have been given a dose in the ED.  Patient urine ketone increase her potassium intake at home and follow-up in 1 week to have your potassium rechecked.

## 2017-02-20 NOTE — ED Provider Notes (Signed)
[facility]-EMERGENCY DEPT Provider Note   CSN: [id number] Arrival date & time: 02/20/17  0112     History   Chief Complaint Chief Complaint  Patient presents with  . Abdominal Pain    HPI Sara Contreras is a 31 y.o. female.  HPI 31 year old African-American female past medical history significant for pancreatitis, history of gastric sleeve resection presents to the emergency department today for evaluation of epigastric abdominal pain.  The patient states her symptoms have been ongoing for 3 days.  Patient states that she had one episode of emesis.  Denies any associated bloody stools or loose stools.  States that she was diagnosed with pancreatitis back in May and has had 2 attacks since then.  Patient denies alcohol use but denies any chronic alcohol use.  Patient has not followed up with a GI doctor to determine why she keeps having pancreatitis.  Patient reports epigastric abdominal pain that is stabbing in nature does not radiate.  States it feels like her typical pancreatitis.  Pain is progressively worsened.  Nothing makes better or worse.  Pain is not associated with food.  Denies any associated urinary symptoms, vaginal symptoms.  Denies any associated fevers or chills. She has not been taking anything for her pain.    Pt denies any fever, chills, ha, vision changes, lightheadedness, dizziness, congestion, neck pain, cp, sob, cough, urinary symptoms, change in bowel habits, melena, hematochezia, lower extremity paresthesias.  Past Medical History:  Diagnosis Date  . Abnormal EKG   . Anxiety   . Asthma   . Gastric ulcer   . Migraines   . Morbid obesity (HCC)   . Pancreatitis     Patient Active Problem List   Diagnosis Date Noted  . Abnormal LFTs   . Acute pancreatitis 06/24/2016    Past Surgical History:  Procedure Laterality Date  . LAPAROSCOPIC GASTRIC SLEEVE RESECTION  2016    OB History    Gravida Para Term Preterm AB Living   1 1 1           SAB TAB Ectopic Multiple Live Births                   Home Medications    Prior to Admission medications   Medication Sig Start Date End Date Taking? Authorizing Provider  albuterol (PROVENTIL HFA;VENTOLIN HFA) 108 (90 BASE) MCG/ACT inhaler Inhale 1-2 puffs into the lungs every 6 (six) hours as needed for wheezing or shortness of breath.    Yes [provider]  albuterol (PROVENTIL) (5 MG/ML) 0.5% nebulizer solution Take 2.5 mg by nebulization every 6 (six) hours as needed for wheezing or shortness of breath.   Yes [provider]  BIOTIN PO Take 1 tablet by mouth daily.   Yes [provider]  diazepam (VALIUM) 5 MG tablet Take 5 mg by mouth 2 (two) times daily as needed for anxiety.  04/24/16  Yes [provider]  escitalopram (LEXAPRO) 20 MG tablet Take 20 mg by mouth daily. 06/19/16  Yes [provider]  Multiple Vitamins-Minerals (MULTIVITAMIN ADULTS PO) Take 1 tablet by mouth daily.   Yes [provider]  pantoprazole (PROTONIX) 40 MG tablet Take 40 mg by mouth daily. 11/12/16  Yes [provider]  tiZANidine (ZANAFLEX) 4 MG tablet Take 4 mg by mouth every 8 (eight) hours as needed for muscle spasms.   Yes [provider]  HYDROcodone-acetaminophen (NORCO/VICODIN) 5-325 MG tablet Take 1-2 tablets by mouth every 4 (four)  hours as needed. 02/20/17   Rise MuLeaphart, Kenneth T, PA-C  morphine (MSIR) 15 MG tablet Take 1 tablet (15 mg total) by mouth every 6 (six) hours as needed for severe pain. Patient not taking: Reported on 02/20/2017 11/26/16   Michela PitcherFawze, Mina A, PA-C  ondansetron (ZOFRAN-ODT) 4 MG disintegrating tablet Take 1 tablet (4 mg total) by mouth every 8 (eight) hours as needed for nausea. 02/20/17   Demetrios LollLeaphart, Kenneth T, PA-C  potassium chloride (K-DUR) 10 MEQ tablet Take 1 tablet (10 mEq total) by mouth daily. Patient not taking: Reported on 11/26/2016 06/28/16   Alison Murrayevine, Alma M, MD    Family History Family History    Problem Relation Age of Onset  . Hypertension Other   . Diabetes Other     Social History Social History   Tobacco Use  . Smoking status: Current Some Day Smoker    Types: Cigarettes  . Smokeless tobacco: Never Used  . Tobacco comment: rarely smokes  Substance Use Topics  . Alcohol use: Yes    Comment: occassional  . Drug use: No     Allergies   Methylene blue and Contrast media [iodinated diagnostic agents]   Review of Systems Review of Systems  Constitutional: Negative for chills and fever.  HENT: Negative for congestion.   Eyes: Negative for visual disturbance.  Respiratory: Negative for cough and shortness of breath.   Cardiovascular: Negative for chest pain.  Gastrointestinal: Positive for abdominal pain, nausea and vomiting. Negative for diarrhea.  Genitourinary: Negative for dysuria, flank pain, frequency, hematuria, urgency, vaginal bleeding and vaginal discharge.  Musculoskeletal: Negative for arthralgias and myalgias.  Skin: Negative for rash.  Neurological: Negative for dizziness, syncope, weakness, light-headedness, numbness and headaches.  Psychiatric/Behavioral: Negative for sleep disturbance. The patient is not nervous/anxious.      Physical Exam Updated Vital Signs BP (!) 125/92 (BP Location: Right Arm)   Pulse 82   Temp 97.9 F (36.6 C) (Oral)   Resp 18   LMP 02/15/2017   SpO2 100%   Physical Exam  Constitutional: She is oriented to person, place, and time. She appears well-developed and well-nourished.  Non-toxic appearance. No distress.  HENT:  Head: Normocephalic and atraumatic.  Nose: Nose normal.  Mouth/Throat: Oropharynx is clear and moist.  Eyes: Conjunctivae are normal. Pupils are equal, round, and reactive to light. Right eye exhibits no discharge. Left eye exhibits no discharge.  Neck: Normal range of motion. Neck supple.  Cardiovascular: Normal rate, regular rhythm, normal heart sounds and intact distal pulses. Exam reveals no  gallop and no friction rub.  No murmur heard. Pulmonary/Chest: Effort normal and breath sounds normal. No stridor. No respiratory distress. She has no wheezes. She has no rales. She exhibits no tenderness.  Abdominal: Soft. Bowel sounds are normal. There is generalized tenderness and tenderness in the right upper quadrant and epigastric area. There is no rigidity, no rebound, no guarding, no CVA tenderness, no tenderness at McBurney's point and negative Murphy's sign.  Musculoskeletal: Normal range of motion. She exhibits no tenderness.  Lymphadenopathy:    She has no cervical adenopathy.  Neurological: She is alert and oriented to person, place, and time.  Skin: Skin is warm and dry. Capillary refill takes less than 2 seconds.  Psychiatric: Her behavior is normal. Judgment and thought content normal.  Nursing note and vitals reviewed.    ED Treatments / Results  Labs (all labs ordered are listed, but only abnormal results are displayed) Labs Reviewed  LIPASE, BLOOD - Abnormal; Notable  for the following components:      Result Value   Lipase 62 (*)    All other components within normal limits  COMPREHENSIVE METABOLIC PANEL - Abnormal; Notable for the following components:   Sodium 134 (*)    Potassium 3.3 (*)    Chloride 100 (*)    Calcium 8.8 (*)    All other components within normal limits  CBC - Abnormal; Notable for the following components:   RBC 3.20 (*)    Hemoglobin 10.2 (*)    HCT 30.0 (*)    RDW 17.7 (*)    All other components within normal limits  AMYLASE  URINALYSIS, ROUTINE W REFLEX MICROSCOPIC  I-STAT BETA HCG BLOOD, ED ([facility], [facility], [facility] ONLY)    EKG  EKG Interpretation None       Radiology Ct Abdomen Pelvis Wo Contrast  Result Date: 02/20/2017 CLINICAL DATA:  31 year old female with abdominal pain. EXAM: CT ABDOMEN AND PELVIS WITHOUT CONTRAST TECHNIQUE: Multidetector CT imaging of the abdomen and pelvis was performed following the standard protocol without  IV contrast. COMPARISON:  Abdominal CT dated 07/25/2012 and MRCP dated 06/25/2016 FINDINGS: Evaluation of this exam is limited in the absence of intravenous contrast. Lower chest: The visualized lung bases are clear. There is hypoattenuation of the cardiac blood pool suggestive of a degree of anemia. Clinical correlation is recommended. No intra-abdominal free air or free fluid. Hepatobiliary: Probable mild fatty infiltration of the liver. No intrahepatic biliary ductal dilatation. The gallbladder is unremarkable. Pancreas: There is mild hazy appearance of the pancreas and peripancreatic fat. Correlation with pancreatic enzymes recommended to evaluate for possibility of acute pancreatitis. No fluid collection or abscess. Spleen: Normal in size without focal abnormality. Adrenals/Urinary Tract: The adrenal glands are unremarkable. The kidneys, visualized ureters, and urinary bladder appear unremarkable. Stomach/Bowel: Postsurgical changes of gastric sleeve. There is no evidence of bowel obstruction or active inflammation. Normal appendix. Vascular/Lymphatic: The abdominal aorta and IVC are grossly unremarkable on this noncontrast CT. No portal venous gas. There is no adenopathy. Reproductive: The uterus is anteverted and grossly unremarkable. The ovaries are poorly visualized. Other: None Musculoskeletal: No acute or significant osseous findings. IMPRESSION: 1. Slight hazy appearance of the pancreas. Correlation with pancreatic enzymes recommended to exclude pancreatitis. 2. Postsurgical changes of gastric sleeve. No bowel obstruction or active inflammation. Normal appendix. Electronically Signed   By: Elgie Collard M.D.   On: 02/20/2017 06:31    Procedures Procedures (including critical care time)  Medications Ordered in ED Medications  iopamidol (ISOVUE-300) 61 % injection (not administered)  ondansetron (ZOFRAN-ODT) disintegrating tablet 4 mg (4 mg Oral Given 02/20/17 0123)  sodium chloride 0.9 % bolus  1,000 mL (0 mLs Intravenous Stopped 02/20/17 0613)  HYDROmorphone (DILAUDID) injection 0.5 mg (0.5 mg Intravenous Given 02/20/17 0501)  ondansetron (ZOFRAN) injection 4 mg (4 mg Intravenous Given 02/20/17 0502)  gi cocktail (Maalox,Lidocaine,Donnatal) (30 mLs Oral Given 02/20/17 0502)  iopamidol (ISOVUE-300) 61 % injection 15 mL (15 mLs Oral Contrast Given 02/20/17 0521)  HYDROmorphone (DILAUDID) injection 1 mg (1 mg Intravenous Given 02/20/17 1610)  oxyCODONE-acetaminophen (PERCOCET/ROXICET) 5-325 MG per tablet 1 tablet (1 tablet Oral Given 02/20/17 0722)  diphenhydrAMINE (BENADRYL) injection 25 mg (25 mg Intravenous Given 02/20/17 0722)  potassium chloride SA (K-DUR,KLOR-CON) CR tablet 40 mEq (40 mEq Oral Given 02/20/17 9604)     Initial Impression / Assessment and Plan / ED Course  I have reviewed the triage vital signs and the nursing notes.  Pertinent labs & imaging results  that were available during my care of the patient were reviewed by me and considered in my medical decision making (see chart for details).     Patient resents to the ED for evaluation of epigastric abdominal pain with history of pancreatitis and states this feels similar.  Reports associated nausea, vomiting.   Patient is overall well-appearing and nontoxic.  Vital signs are reassuring.  Patient is afebrile.  She is nontoxic appearing.  Patient has epigastric abdominal pain to palpation but no signs of peritonitis.  Lungs clear to auscultation bilaterally.  Regular rate and rhythm no rubs murmurs gallops.  Patient has no significant signs of dehydration.  Lab work significant for lipase of 62.  No leukocytosis.  Stable hemoglobin.  Normal amylase.  This was requested by patient.  Potassium is 3.3 which was replaced with the dose and oral potassium in the ED.  Electrolytes are reassuring.  Given patient's tenderness with only a mildly elevated lipase CT scan was ordered to rule out any other acute abnormalities.  CT scan  reports some haziness over the pancreas that can correlate with acute pancreatitis.  No other acute abnormalities noted.  Patient feels much improved after pain medicine and fluids.  She is tolerating p.o. fluids on emesis.  States that she feels that she is ready for discharge.  Repeat abdominal exam is benign.  No signs of peritonitis.  Will send patient home with pain medicine nausea medicine.  Encourage clear liquid diet and advance as tolerated.  Pt is hemodynamically stable, in NAD, & able to ambulate in the ED. Evaluation does not show pathology that would require ongoing emergent intervention or inpatient treatment. I explained the diagnosis to the patient. Pain has been managed & has no complaints prior to dc. Pt is comfortable with above plan and is stable for discharge at this time. All questions were answered prior to disposition. Strict return precautions for f/u to the ED were discussed. Encouraged follow up with PCP.  I also encouraged patient to follow-up with a GI doctor for further management of her pancreatitis.  Final Clinical Impressions(s) / ED Diagnoses   Final diagnoses:  Acute pancreatitis without infection or necrosis, unspecified pancreatitis type    ED Discharge Orders        Ordered    HYDROcodone-acetaminophen (NORCO/VICODIN) 5-325 MG tablet  Every 4 hours PRN     02/20/17 0720    ondansetron (ZOFRAN-ODT) 4 MG disintegrating tablet  Every 8 hours PRN     02/20/17 0720       Rise Mu, PA-C 02/20/17 0738    Melene Plan, DO 02/20/17 2303

## 2017-02-20 NOTE — ED Triage Notes (Addendum)
Pt from home via EMS with c/o upper abdominal pain. Pt has hx of pancreatitis. Pt has had symptoms x 3 days. Pt has had  2 episodes of emesis total today- 1 episode of emesis with EMS- she stuck her finger down her throat and forced herself to throw up per EMS. Pt took a muscle relaxer PTA on an empty stomach which she states added to her nausea.

## 2018-07-25 IMAGING — CT CT ABD-PELV W/O CM
2 of 4 series · 16 of 46 positions shown, 18 images · non-contrast
Comparison: Abdominal CT dated 07/25/2012 and MRCP dated 06/25/2016

CLINICAL DATA: 31-year-old female with abdominal pain.

EXAM:
CT ABDOMEN AND PELVIS WITHOUT CONTRAST
TECHNIQUE: Multidetector CT imaging of the abdomen and pelvis was performed
following the standard protocol without IV contrast.

[Series 2: axial st · axial · 0.86mm/px · z∈[-496,-106]mm · 13 of 92 slices shown, 15 images]
[im 7/92  soft-tissue]
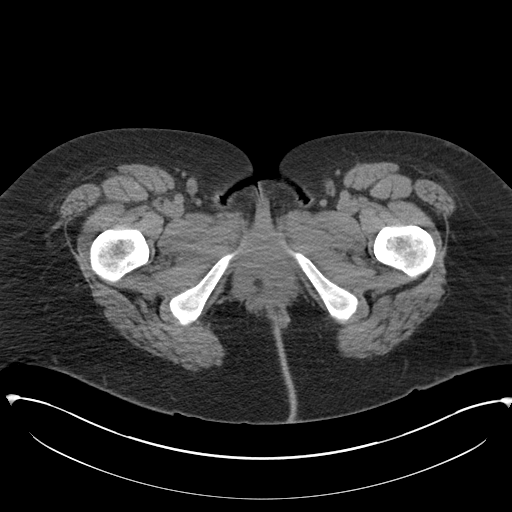
[im 7/92  bone]
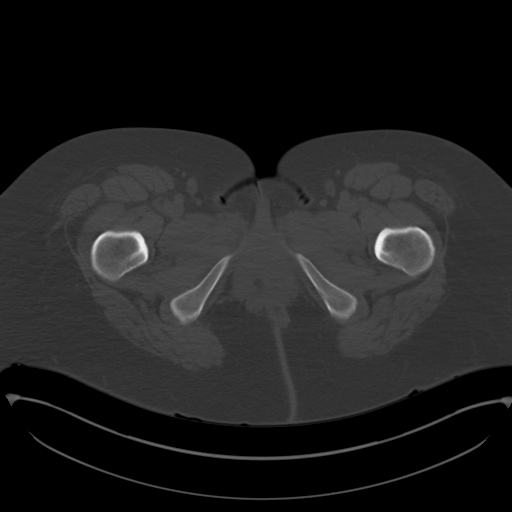
[im 13/92  soft-tissue]
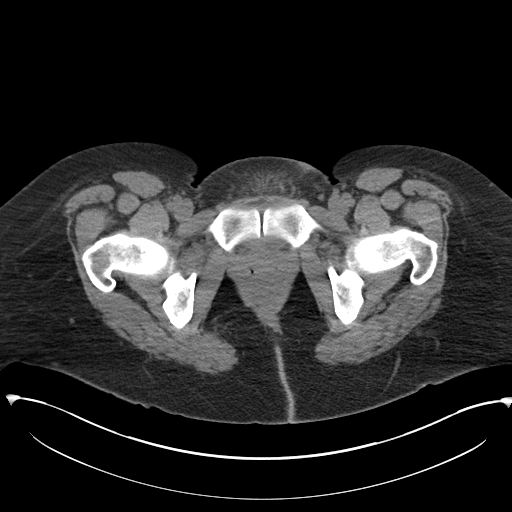
[im 19/92  soft-tissue]
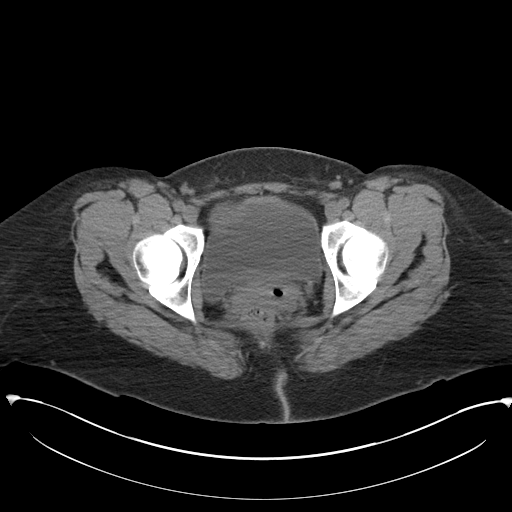
[im 25/92  soft-tissue]
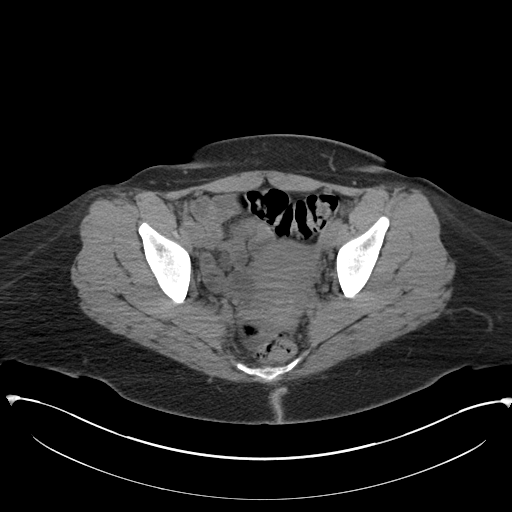
[im 31/92  soft-tissue]
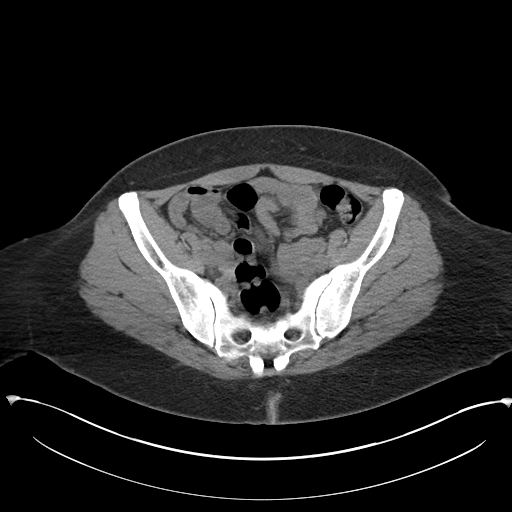
[im 37/92  soft-tissue]
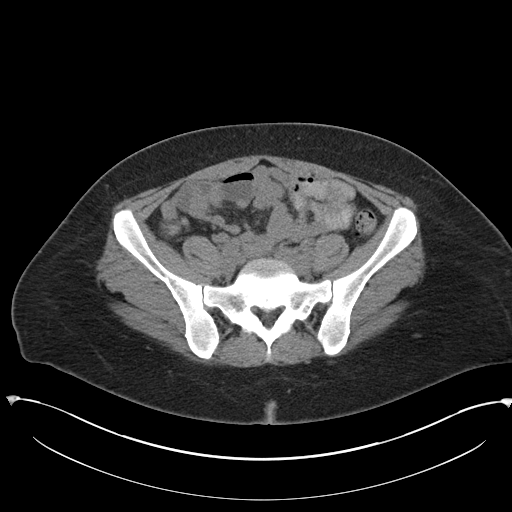
[im 49/92  soft-tissue]
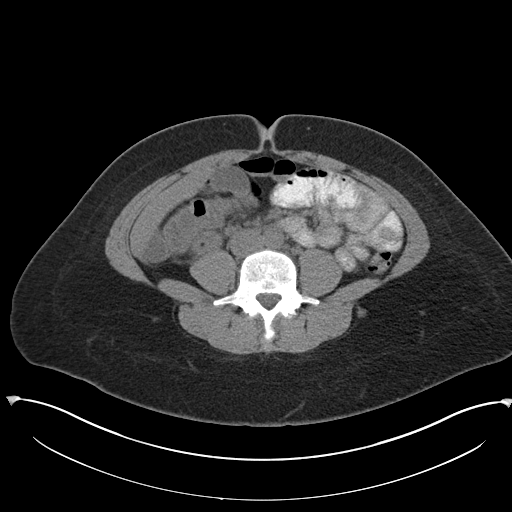
[im 55/92  soft-tissue]
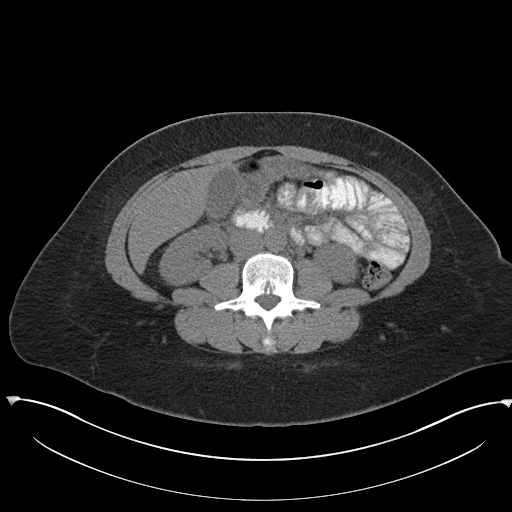
[im 61/92  soft-tissue]
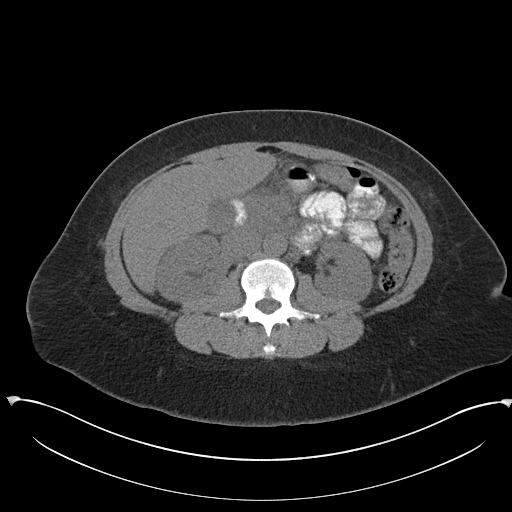
[im 61/92  bone]
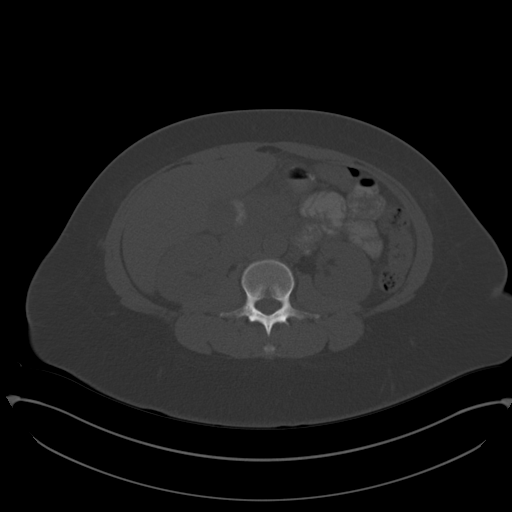
[im 67/92  soft-tissue]
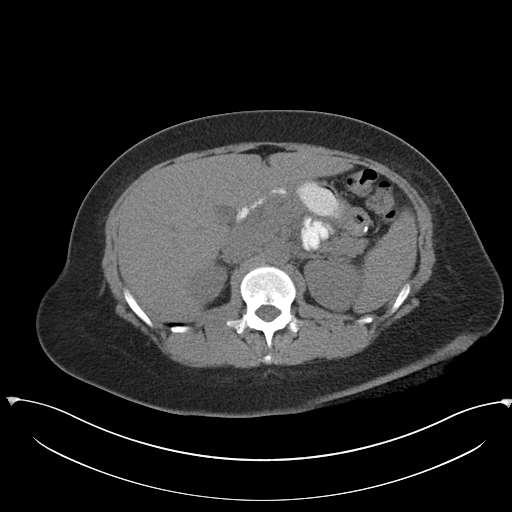
[im 73/92  soft-tissue]
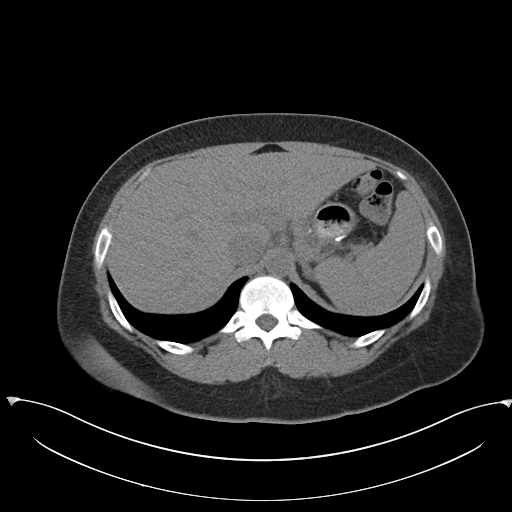
[im 79/92  soft-tissue]
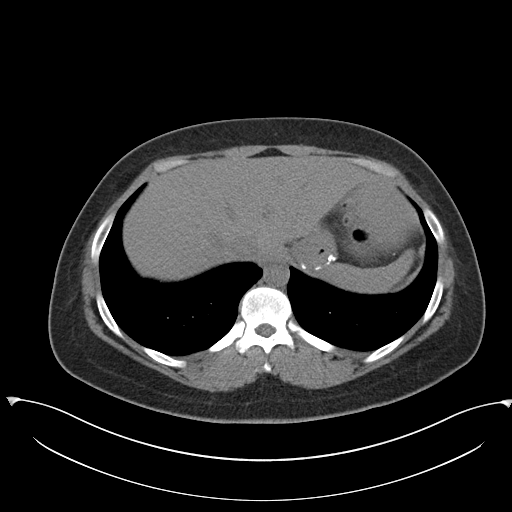
[im 85/92  soft-tissue]
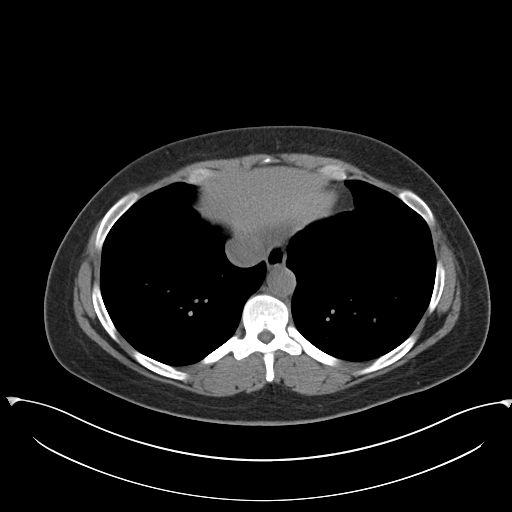

[Series 5: coronal st · coronal · 0.83mm/px · 3 of 84 slices shown]
[im 28/84  soft-tissue]
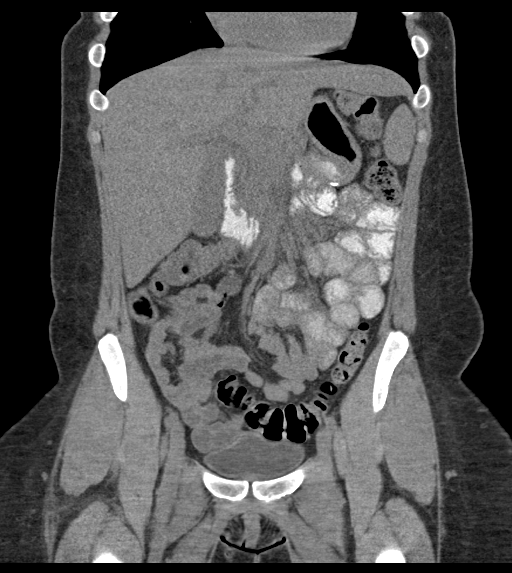
[im 37/84  soft-tissue]
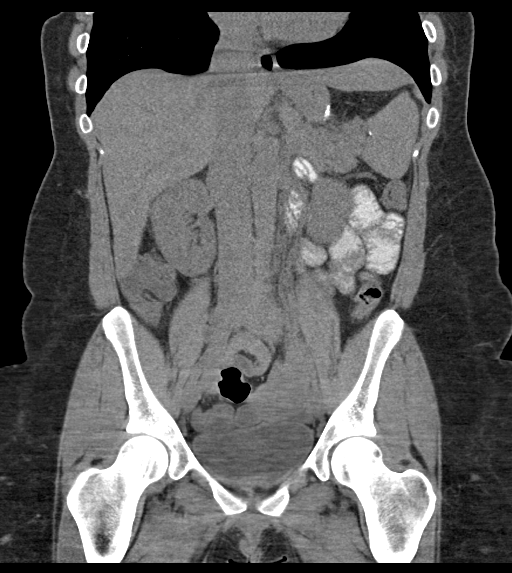
[im 47/84  soft-tissue]
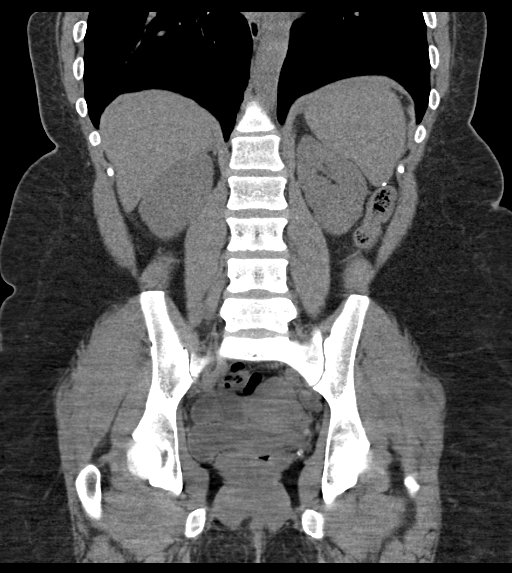

[16 of 46 positions shown; findings below may reference images not displayed]

FINDINGS: Evaluation of this exam is limited in the absence of intravenous
contrast.

Lower chest: The visualized lung bases are clear. There is
hypoattenuation of the cardiac blood pool suggestive of a degree of
anemia. Clinical correlation is recommended. No intra-abdominal free
air or free fluid.

Hepatobiliary: Probable mild fatty infiltration of the liver. No
intrahepatic biliary ductal dilatation. The gallbladder is
unremarkable.

Pancreas: There is mild hazy appearance of the pancreas and
peripancreatic fat. Correlation with pancreatic enzymes recommended
to evaluate for possibility of acute pancreatitis. No fluid
collection or abscess.

Spleen: Normal in size without focal abnormality.

Adrenals/Urinary Tract: The adrenal glands are unremarkable. The
kidneys, visualized ureters, and urinary bladder appear
unremarkable.

Stomach/Bowel: Postsurgical changes of gastric sleeve. There is no
evidence of bowel obstruction or active inflammation. Normal
appendix.

Vascular/Lymphatic: The abdominal aorta and IVC are grossly
unremarkable on this noncontrast CT. No portal venous gas. There is
no adenopathy.

Reproductive: The uterus is anteverted and grossly unremarkable. The
ovaries are poorly visualized.

Other: None

Musculoskeletal: No acute or significant osseous findings.
IMPRESSION: 1. Slight hazy appearance of the pancreas. Correlation with
pancreatic enzymes recommended to exclude pancreatitis.
2. Postsurgical changes of gastric sleeve. No bowel obstruction or
active inflammation. Normal appendix.

## 2020-08-08 ENCOUNTER — Other Ambulatory Visit: Payer: Self-pay

## 2020-08-08 ENCOUNTER — Emergency Department (HOSPITAL_COMMUNITY)
Admission: EM | Admit: 2020-08-08 | Discharge: 2020-08-09 | DRG: 812 | Disposition: A | Discharge status: Home / Self Care | Payer: Medicaid Other | Attending: Family Medicine | Admitting: Family Medicine

## 2020-08-08 ENCOUNTER — Encounter (HOSPITAL_COMMUNITY): Payer: Self-pay | Admitting: Emergency Medicine

## 2020-08-08 DIAGNOSIS — Z8711 Personal history of peptic ulcer disease: Secondary | ICD-10-CM | POA: Diagnosis not present

## 2020-08-08 DIAGNOSIS — D62 Acute posthemorrhagic anemia: Secondary | ICD-10-CM | POA: Diagnosis not present

## 2020-08-08 DIAGNOSIS — Z9181 History of falling: Secondary | ICD-10-CM

## 2020-08-08 DIAGNOSIS — F419 Anxiety disorder, unspecified: Secondary | ICD-10-CM | POA: Diagnosis present

## 2020-08-08 DIAGNOSIS — F32A Depression, unspecified: Secondary | ICD-10-CM | POA: Diagnosis not present

## 2020-08-08 DIAGNOSIS — Z91041 Radiographic dye allergy status: Secondary | ICD-10-CM

## 2020-08-08 DIAGNOSIS — Z79899 Other long term (current) drug therapy: Secondary | ICD-10-CM | POA: Diagnosis not present

## 2020-08-08 DIAGNOSIS — Z823 Family history of stroke: Secondary | ICD-10-CM

## 2020-08-08 DIAGNOSIS — D509 Iron deficiency anemia, unspecified: Secondary | ICD-10-CM | POA: Diagnosis present

## 2020-08-08 DIAGNOSIS — J45909 Unspecified asthma, uncomplicated: Secondary | ICD-10-CM | POA: Diagnosis present

## 2020-08-08 DIAGNOSIS — F17210 Nicotine dependence, cigarettes, uncomplicated: Secondary | ICD-10-CM | POA: Diagnosis not present

## 2020-08-08 DIAGNOSIS — Z8249 Family history of ischemic heart disease and other diseases of the circulatory system: Secondary | ICD-10-CM | POA: Diagnosis not present

## 2020-08-08 DIAGNOSIS — Z833 Family history of diabetes mellitus: Secondary | ICD-10-CM

## 2020-08-08 DIAGNOSIS — Z9884 Bariatric surgery status: Secondary | ICD-10-CM | POA: Diagnosis not present

## 2020-08-08 DIAGNOSIS — K219 Gastro-esophageal reflux disease without esophagitis: Secondary | ICD-10-CM | POA: Diagnosis present

## 2020-08-08 DIAGNOSIS — R519 Headache, unspecified: Secondary | ICD-10-CM

## 2020-08-08 DIAGNOSIS — Z20822 Contact with and (suspected) exposure to covid-19: Secondary | ICD-10-CM | POA: Diagnosis present

## 2020-08-08 DIAGNOSIS — N920 Excessive and frequent menstruation with regular cycle: Secondary | ICD-10-CM | POA: Diagnosis present

## 2020-08-08 DIAGNOSIS — D649 Anemia, unspecified: Secondary | ICD-10-CM

## 2020-08-08 MED ORDER — HYDROCORTISONE NA SUCCINATE PF 250 MG IJ SOLR
200.0000 mg | Freq: Once | INTRAMUSCULAR | Status: AC
  Administered 2020-08-09: 200 mg via INTRAVENOUS
  Filled 2020-08-08: qty 200

## 2020-08-08 MED ORDER — FENTANYL CITRATE (PF) 100 MCG/2ML IJ SOLN
50.0000 ug | Freq: Once | INTRAMUSCULAR | Status: AC
  Administered 2020-08-09: 50 ug via INTRAVENOUS
  Filled 2020-08-08: qty 2

## 2020-08-08 MED ORDER — SODIUM CHLORIDE 0.9 % IV BOLUS
1000.0000 mL | Freq: Once | INTRAVENOUS | Status: AC
  Administered 2020-08-09: 1000 mL via INTRAVENOUS

## 2020-08-08 MED ORDER — DIPHENHYDRAMINE HCL 50 MG/ML IJ SOLN
50.0000 mg | Freq: Once | INTRAMUSCULAR | Status: AC
  Administered 2020-08-09: 50 mg via INTRAVENOUS
  Filled 2020-08-08: qty 1

## 2020-08-08 MED ORDER — ONDANSETRON HCL 4 MG/2ML IJ SOLN
4.0000 mg | Freq: Once | INTRAMUSCULAR | Status: AC
  Administered 2020-08-09: 4 mg via INTRAVENOUS
  Filled 2020-08-08: qty 2

## 2020-08-08 MED ORDER — DIPHENHYDRAMINE HCL 25 MG PO CAPS
50.0000 mg | ORAL_CAPSULE | Freq: Once | ORAL | Status: AC
  Filled 2020-08-08: qty 2

## 2020-08-08 NOTE — ED Triage Notes (Signed)
Patient reports fall in store in March with intermittent headache since that time. Reports this episode x3 days. Reports light sensitivity with pressure behind eyes.

## 2020-08-09 ENCOUNTER — Inpatient Hospital Stay (HOSPITAL_COMMUNITY): Payer: Medicaid Other

## 2020-08-09 DIAGNOSIS — D62 Acute posthemorrhagic anemia: Secondary | ICD-10-CM

## 2020-08-09 DIAGNOSIS — D500 Iron deficiency anemia secondary to blood loss (chronic): Secondary | ICD-10-CM

## 2020-08-09 DIAGNOSIS — F32A Depression, unspecified: Secondary | ICD-10-CM

## 2020-08-09 DIAGNOSIS — R519 Headache, unspecified: Secondary | ICD-10-CM

## 2020-08-09 DIAGNOSIS — F419 Anxiety disorder, unspecified: Secondary | ICD-10-CM

## 2020-08-09 DIAGNOSIS — N921 Excessive and frequent menstruation with irregular cycle: Secondary | ICD-10-CM

## 2020-08-09 DIAGNOSIS — G8929 Other chronic pain: Secondary | ICD-10-CM

## 2020-08-09 LAB — CBC WITH DIFFERENTIAL/PLATELET
Abs Immature Granulocytes: 0.01 10*3/uL (ref 0.00–0.07)
Basophils Absolute: 0 10*3/uL (ref 0.0–0.1)
Basophils Relative: 1 %
Eosinophils Absolute: 0.2 10*3/uL (ref 0.0–0.5)
Eosinophils Relative: 3 %
HCT: 24.3 % — ABNORMAL LOW (ref 36.0–46.0)
Hemoglobin: 6.9 g/dL — CL (ref 12.0–15.0)
Immature Granulocytes: 0 %
Lymphocytes Relative: 44 %
Lymphs Abs: 2.8 10*3/uL (ref 0.7–4.0)
MCH: 19.1 pg — ABNORMAL LOW (ref 26.0–34.0)
MCHC: 28.4 g/dL — ABNORMAL LOW (ref 30.0–36.0)
MCV: 67.3 fL — ABNORMAL LOW (ref 80.0–100.0)
Monocytes Absolute: 0.6 10*3/uL (ref 0.1–1.0)
Monocytes Relative: 10 %
Neutro Abs: 2.7 10*3/uL (ref 1.7–7.7)
Neutrophils Relative %: 42 %
Platelets: 297 10*3/uL (ref 150–400)
RBC: 3.61 MIL/uL — ABNORMAL LOW (ref 3.87–5.11)
RDW: 21.2 % — ABNORMAL HIGH (ref 11.5–15.5)
WBC: 6.3 10*3/uL (ref 4.0–10.5)
nRBC: 0 % (ref 0.0–0.2)

## 2020-08-09 LAB — BASIC METABOLIC PANEL
Anion gap: 4 — ABNORMAL LOW (ref 5–15)
Anion gap: 5 (ref 5–15)
BUN: 5 mg/dL — ABNORMAL LOW (ref 6–20)
BUN: 7 mg/dL (ref 6–20)
CO2: 21 mmol/L — ABNORMAL LOW (ref 22–32)
CO2: 26 mmol/L (ref 22–32)
Calcium: 6.9 mg/dL — ABNORMAL LOW (ref 8.9–10.3)
Calcium: 8.9 mg/dL (ref 8.9–10.3)
Chloride: 105 mmol/L (ref 98–111)
Chloride: 114 mmol/L — ABNORMAL HIGH (ref 98–111)
Creatinine, Ser: 0.33 mg/dL — ABNORMAL LOW (ref 0.44–1.00)
Creatinine, Ser: 0.67 mg/dL (ref 0.44–1.00)
GFR, Estimated: >60 mL/min (ref >60)
GFR, Estimated: >60 mL/min (ref >60)
Glucose, Bld: 77 mg/dL (ref 70–99)
Glucose, Bld: 88 mg/dL (ref 70–99)
Potassium: 3.1 mmol/L — ABNORMAL LOW (ref 3.5–5.1)
Potassium: 3.6 mmol/L (ref 3.5–5.1)
Sodium: 136 mmol/L (ref 135–145)
Sodium: 139 mmol/L (ref 135–145)

## 2020-08-09 LAB — I-STAT BETA HCG BLOOD, ED (MC, WL, AP ONLY): I-stat hCG, quantitative: <5 m[IU]/mL (ref <5)

## 2020-08-09 LAB — HEMOGLOBIN AND HEMATOCRIT, BLOOD
HCT: 26.5 % — ABNORMAL LOW (ref 36.0–46.0)
Hemoglobin: 7.6 g/dL — ABNORMAL LOW (ref 12.0–15.0)

## 2020-08-09 LAB — CBC
HCT: 21.5 % — ABNORMAL LOW (ref 36.0–46.0)
Hemoglobin: 6 g/dL — CL (ref 12.0–15.0)
MCH: 18.8 pg — ABNORMAL LOW (ref 26.0–34.0)
MCHC: 27.9 g/dL — ABNORMAL LOW (ref 30.0–36.0)
MCV: 67.4 fL — ABNORMAL LOW (ref 80.0–100.0)
Platelets: 260 10*3/uL (ref 150–400)
RBC: 3.19 MIL/uL — ABNORMAL LOW (ref 3.87–5.11)
RDW: 21.2 % — ABNORMAL HIGH (ref 11.5–15.5)
WBC: 4.5 10*3/uL (ref 4.0–10.5)
nRBC: 0 % (ref 0.0–0.2)

## 2020-08-09 LAB — HIV ANTIBODY (ROUTINE TESTING W REFLEX): HIV Screen 4th Generation wRfx: NONREACTIVE

## 2020-08-09 LAB — TYPE AND SCREEN

## 2020-08-09 LAB — PREPARE RBC (CROSSMATCH)

## 2020-08-09 LAB — POC OCCULT BLOOD, ED: Fecal Occult Bld: NEGATIVE

## 2020-08-09 LAB — SARS CORONAVIRUS 2 (TAT 6-24 HRS): SARS Coronavirus 2: NEGATIVE

## 2020-08-09 MED ORDER — ACETAMINOPHEN 500 MG PO TABS: 500.0000 mg | ORAL_TABLET | Freq: Every day | ORAL | Status: DC | PRN

## 2020-08-09 MED ORDER — FERROUS SULFATE 325 (65 FE) MG PO TBEC: 325.0000 mg | DELAYED_RELEASE_TABLET | Freq: Two times a day (BID) | ORAL | 3 refills | Status: AC

## 2020-08-09 MED ORDER — NAPROXEN SODIUM 220 MG PO TABS: 220.0000 mg | ORAL_TABLET | Freq: Two times a day (BID) | ORAL | 3 refills | Status: DC

## 2020-08-09 MED ORDER — ONDANSETRON 4 MG PO TBDP: 4.0000 mg | ORAL_TABLET | Freq: Three times a day (TID) | ORAL | Status: DC | PRN

## 2020-08-09 MED ORDER — DIPHENHYDRAMINE HCL 50 MG/ML IJ SOLN
12.5000 mg | Freq: Once | INTRAMUSCULAR | Status: AC
  Administered 2020-08-09: 12.5 mg via INTRAVENOUS
  Filled 2020-08-09: qty 1

## 2020-08-09 MED ORDER — ALBUTEROL SULFATE HFA 108 (90 BASE) MCG/ACT IN AERS: 1.0000 | INHALATION_SPRAY | Freq: Four times a day (QID) | RESPIRATORY_TRACT | Status: DC | PRN

## 2020-08-09 MED ORDER — PANTOPRAZOLE SODIUM 40 MG PO TBEC: 40.0000 mg | DELAYED_RELEASE_TABLET | Freq: Every day | ORAL | Status: DC

## 2020-08-09 MED ORDER — ADULT MULTIVITAMIN W/MINERALS CH
1.0000 | ORAL_TABLET | Freq: Every day | ORAL | Status: DC
  Administered 2020-08-09: 1 via ORAL
  Filled 2020-08-09: qty 1

## 2020-08-09 MED ORDER — SODIUM CHLORIDE (PF) 0.9 % IJ SOLN
INTRAMUSCULAR | Status: AC
  Filled 2020-08-09: qty 50

## 2020-08-09 MED ORDER — ONDANSETRON HCL 4 MG PO TABS: 4.0000 mg | ORAL_TABLET | Freq: Four times a day (QID) | ORAL | Status: DC | PRN

## 2020-08-09 MED ORDER — IOHEXOL 350 MG/ML SOLN
100.0000 mL | Freq: Once | INTRAVENOUS | Status: AC | PRN
  Administered 2020-08-09: 80 mL via INTRAVENOUS

## 2020-08-09 MED ORDER — TIZANIDINE HCL 4 MG PO TABS: 4.0000 mg | ORAL_TABLET | Freq: Three times a day (TID) | ORAL | Status: DC | PRN

## 2020-08-09 MED ORDER — ESCITALOPRAM OXALATE 10 MG PO TABS: 20.0000 mg | ORAL_TABLET | Freq: Every day | ORAL | Status: DC

## 2020-08-09 MED ORDER — METOCLOPRAMIDE HCL 5 MG/ML IJ SOLN
5.0000 mg | Freq: Once | INTRAMUSCULAR | Status: AC
  Administered 2020-08-09: 5 mg via INTRAVENOUS
  Filled 2020-08-09: qty 2

## 2020-08-09 MED ORDER — BUPROPION HCL ER (XL) 150 MG PO TB24
150.0000 mg | ORAL_TABLET | Freq: Every day | ORAL | Status: DC
  Filled 2020-08-09: qty 1

## 2020-08-09 MED ORDER — SODIUM CHLORIDE 0.9% IV SOLUTION: Freq: Once | INTRAVENOUS | Status: AC

## 2020-08-09 MED ORDER — POTASSIUM CHLORIDE ER 10 MEQ PO TBCR
10.0000 meq | EXTENDED_RELEASE_TABLET | Freq: Every day | ORAL | Status: DC
  Administered 2020-08-09: 10 meq via ORAL
  Filled 2020-08-09 (×2): qty 1

## 2020-08-09 MED ORDER — KETOROLAC TROMETHAMINE 15 MG/ML IJ SOLN
15.0000 mg | Freq: Once | INTRAMUSCULAR | Status: AC
  Administered 2020-08-09: 15 mg via INTRAVENOUS
  Filled 2020-08-09: qty 1

## 2020-08-09 MED ORDER — DIAZEPAM 5 MG PO TABS
5.0000 mg | ORAL_TABLET | Freq: Two times a day (BID) | ORAL | Status: DC | PRN
  Administered 2020-08-09: 5 mg via ORAL
  Filled 2020-08-09: qty 1

## 2020-08-09 MED ORDER — HYDROCODONE-ACETAMINOPHEN 5-325 MG PO TABS
1.0000 | ORAL_TABLET | ORAL | Status: DC | PRN
  Administered 2020-08-09: 2 via ORAL
  Administered 2020-08-09: 1 via ORAL
  Filled 2020-08-09: qty 2
  Filled 2020-08-09: qty 1

## 2020-08-09 MED ORDER — ACETAMINOPHEN 650 MG RE SUPP: 650.0000 mg | Freq: Four times a day (QID) | RECTAL | Status: DC | PRN

## 2020-08-09 MED ORDER — ONDANSETRON HCL 4 MG/2ML IJ SOLN: 4.0000 mg | Freq: Four times a day (QID) | INTRAMUSCULAR | Status: DC | PRN

## 2020-08-09 MED ORDER — ALBUTEROL SULFATE (2.5 MG/3ML) 0.083% IN NEBU: 2.5000 mg | INHALATION_SOLUTION | Freq: Four times a day (QID) | RESPIRATORY_TRACT | Status: DC | PRN

## 2020-08-09 MED ORDER — ACETAMINOPHEN 325 MG PO TABS: 650.0000 mg | ORAL_TABLET | Freq: Four times a day (QID) | ORAL | Status: DC | PRN

## 2020-08-09 MED ORDER — TRAZODONE HCL 50 MG PO TABS: 25.0000 mg | ORAL_TABLET | Freq: Every evening | ORAL | Status: DC | PRN

## 2020-08-09 MED ORDER — SODIUM CHLORIDE 0.9 % IV SOLN: INTRAVENOUS | Status: DC

## 2020-08-09 MED ORDER — SODIUM CHLORIDE 0.9 % IV SOLN
10.0000 mL/h | Freq: Once | INTRAVENOUS | Status: DC
Start: 2020-08-09 — End: 2020-08-09

## 2020-08-09 NOTE — ED Notes (Signed)
MD is coming to discharge pt. Bed no longer needed, charge nurse made aware

## 2020-08-09 NOTE — Discharge Summary (Signed)
Physician Discharge Summary  Sara Contreras WFU:[id number] DOB: November 13, 1985 DOA: 08/08/2020  PCP: Armando Reichert, PA-C  Admit date: 08/08/2020 Discharge date: 08/09/2020  Admitted From: Home  Disposition:  Home   Recommendations for Outpatient Follow-up:  1. Follow up with OB-Gyn in 1-2 weeks for menorrhagia and iron deficiency anemia 2. Follow up with Neurology in 2-6 weeks for chronic headache 3. Follow up with PCP in 3-4 weeks for Hgb recheck and check iron stores      Home Health: None  Equipment/Devices: None  Discharge Condition: Good  CODE STATUS: FULL Diet recommendation: Regular  Brief/Interim Summary: [name] is a 35 y.o. F with hx menorrhagia and iron-def anemia, depression who presented with fall and pre-syncope.  Patient was in her normal state of health until March, fell in a store, hit her head.  Since then, has had recurrent severe headaches.  These typically last for hours to a day, sometimes respond to acetaminophen or ibuprofen.  In recent weeks they are more frequent.  Then in the last few days, she has had a severe headache, lasting four days.  This is associated with pressure in the sinuses, but no rhinorrhea, lacrimation.  To me she describes it as encircling the head and worse in front, to EDP more as unilateral.  It is also associated with some photophobia.  Finally, she was at an appointment yesterday, the headache was throbbing, then she felt dizzy, got up and nearly passed out, so came to the ER.  In the ER, Hgb found to be 6.9 g/dL from recent 7.4 g/dL last Feb and 8.0 g/dL May 2025.    She underwent CT angiogram of the head and neck by EDP that showed no fracture of skull or vertebrae, no mass, no aneurysm, no intracranial hemorrhage.            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  Patient's ferritin was 3 last Feb, I do not see follow up arranged.    Transfused one unit here, felt better.  Post-tranfusions Hgb >7 g/dL.  No melena, but describes "long" periods, with heavy bleeding, using 5-6 tampons per day, plus pads, and having "large" clots as well.  She is not actively bleeding, do not recommend progesterone today. Discussed with Dr. Jolayne Panther, will need work up for fibroids, polyps, etc., best in outpatient setting.  I attempted to set up appointment today, but clinic closed.  Patient is established previously with [facility], will call there first.  -Referral to OB-Gyn for menometrorrhagia eval and treatment  -Needs repeat ferritin and Hgb in 2 months      Chronic headaches These appear to have started last March after falling and hitting her head.  Neuraxial imaging has not been done prior to today.  Here, CTA of the head and neck rules out aneurysm, subarachnoid, mass, fracture, sinusitis.  She identifies head trauma earlier this year at grocery store as clear inciting factor.  I would not expect migraines to have an inciting factor like that.  Postconcussive headache syndrome is a consideration.  Given the effect this is having on her work and functional status I recommend specialty headache evaluation.  I would expect postconcussion headaches to last for months.  -Referral to [facility] for chronic headache evaluation, and possibly preventive regimen if able  Depression Continue Wellbutrin         Discharge Instructions  Discharge Instructions    Ambulatory referral to Neurology   Complete by: As directed    An appointment is requested in approximately: 4 weeks For headaches, possibly post-concussive   Discharge instructions   Complete by: As directed    You were admitted for passing out from anemia.  Your anemia is likely from heavy menstrual  periods.  To replace your iron stores and allow your body to replace the blood, take iron supplements Take ferrous sulfate 325 mg twice daily every other day  Take it with orange juice Some people get constipated with ferrous sulfate iron supplements, so if you do, take docusate/Colace 100 mg once or twice a day (this is a stool softener)  The body absorbs iron better if you take it every other day  [facility] the [facility] (listed below) or call the [facility] (also listed below)    For the headaches: For now, take acetaminophen/Tylenol 500 mg for mild daily headache But beware of taking medicines every day, this can lead to medication overuse headaches   If you have a bad headache, rest, drink fluids, avoid stimulation, and take naproxen/Aleve 440 mg (two tabs) and rest.  I have sent the referral to [facility] They will call you for an appointment with Dr. Artemio Alyoni Ahern for headaches If they haven't called in 10 days, call them at the number listed below If you can't get an appointment with Dr. Lucia GaskinsAhern, you might ask them for other sooner appointments   There is also the [facility], which is independent.  I don't know much about them.   Increase activity slowly   Complete by: As directed      Allergies as of 08/09/2020      Reactions   Methylene Blue Hives   Contrast Media [iodinated Diagnostic Agents] Hives      Medication List    STOP taking these medications   HYDROcodone-acetaminophen 5-325 MG tablet Commonly known as: NORCO/VICODIN   morphine 15 MG tablet Commonly known as: MSIR     TAKE these medications   acetaminophen 500 MG tablet Commonly known as: TYLENOL Take 1-2 tablets (500-1,000 mg total) by mouth daily as needed for headache.   albuterol 108 (90 Base) MCG/ACT inhaler Commonly known as: VENTOLIN HFA Inhale 1-2 puffs into the lungs every 6 (six) hours as needed for wheezing or shortness of  breath.   albuterol (5 MG/ML) 0.5% nebulizer solution Commonly known as: PROVENTIL Take 2.5 mg by nebulization every 6 (six) hours as needed for wheezing or shortness of breath.   buPROPion 150 MG 24 hr tablet Commonly known as: WELLBUTRIN XL Take 150 tablets by mouth daily.   ferrous sulfate 325 (65 FE) MG EC tablet Take 1 tablet (325 mg total) by mouth 2 (two) times daily.   hydrOXYzine 25 MG capsule Commonly known as: VISTARIL Take 25 mg by mouth every 4 (four) hours as needed for anxiety.   naproxen sodium 220 MG tablet Commonly known as: ALEVE Take 1 tablet (220 mg total) by mouth 2 (two) times daily with a meal.   ondansetron 4 MG disintegrating tablet Commonly known as: ZOFRAN-ODT Take 1 tablet (4 mg total) by mouth every 8 (eight) hours as needed for nausea.   potassium chloride 10 MEQ tablet Commonly known as: KLOR-CON Take 1 tablet (10 mEq total) by mouth daily.       Follow-up Information  Anson Fret, MD. Call.   Specialty: Neurology Why: Call in 10 days if they have not called you for an appointment for headaches Contact information: [address] [place] [address] [phone]        Waynard Reeds, MD. Schedule an appointment as soon as possible for a visit in 1 week(s).   Specialty: Obstetrics and Gynecology Why: Make an appointment in 1-2 weeks Contact information: [address] [place] [address] [phone]        [facility] for [facility] Follow up.   Specialty: Obstetrics and Gynecology Why: If you are unable to schedule with Dr. Tenny Craw, call the Center for [facility] or one of their satellites for an appointment in 1-2 weeks Contact information: [address] [place] [address] [phone]             Allergies  Allergen Reactions  . Methylene Blue Hives  . Contrast Media [Iodinated Diagnostic Agents] Hives     Consultations:  OB-Gyn by phone   Procedures/Studies: CT Angio Head W or Wo Contrast  Result Date: 08/09/2020 CLINICAL DATA:  Intermittent headaches since fall. Headache for 3 days. EXAM: CT ANGIOGRAPHY HEAD AND NECK TECHNIQUE: Multidetector CT imaging of the head and neck was performed using the standard protocol during bolus administration of intravenous contrast. Multiplanar CT image reconstructions and MIPs were obtained to evaluate the vascular anatomy. Carotid stenosis measurements (when applicable) are obtained utilizing NASCET criteria, using the distal internal carotid diameter as the denominator. CONTRAST:  80mL OMNIPAQUE IOHEXOL 350 MG/ML SOLN COMPARISON:  07/28/2015 FINDINGS: CT HEAD FINDINGS Brain: No evidence of acute infarction, hemorrhage, hydrocephalus, extra-axial collection or mass lesion/mass effect. Vascular: See below Skull: Normal. Negative for fracture or focal lesion. Sinuses: Clear Orbits: Negative Review of the MIP images confirms the above findings CTA NECK FINDINGS Aortic arch: Normal where covered. Right carotid system: No stenosis, beading, or atheromatous changes Left carotid system: No stenosis, beading, or atheromatous changes Vertebral arteries: Smooth and widely patent. Skeleton: Negative Other neck: No visible inflammation or mass. Upper chest: Negative Review of the MIP images confirms the above findings CTA HEAD FINDINGS Anterior circulation: Vessels are smooth and widely patent. Negative for aneurysm or atheromatous changes Posterior circulation: Vessels are smooth and widely patent.Negative for aneurysm or atheromatous changes Venous sinuses: Diffusely patent Anatomic variants: Negative Review of the MIP images confirms the above findings IMPRESSION: Negative CTA of the head and neck. Electronically Signed   By: Marnee Spring M.D.   On: 08/09/2020 05:55   CT Angio Neck W and/or Wo Contrast  Result Date: 08/09/2020 CLINICAL DATA:  Intermittent headaches  since fall. Headache for 3 days. EXAM: CT ANGIOGRAPHY HEAD AND NECK TECHNIQUE: Multidetector CT imaging of the head and neck was performed using the standard protocol during bolus administration of intravenous contrast. Multiplanar CT image reconstructions and MIPs were obtained to evaluate the vascular anatomy. Carotid stenosis measurements (when applicable) are obtained utilizing NASCET criteria, using the distal internal carotid diameter as the denominator. CONTRAST:  80mL OMNIPAQUE IOHEXOL 350 MG/ML SOLN COMPARISON:  07/28/2015 FINDINGS: CT HEAD FINDINGS Brain: No evidence of acute infarction, hemorrhage, hydrocephalus, extra-axial collection or mass lesion/mass effect. Vascular: See below Skull: Normal. Negative for fracture or focal lesion. Sinuses: Clear Orbits: Negative Review of the MIP images confirms the above findings CTA NECK FINDINGS Aortic arch: Normal where covered. Right carotid system: No stenosis, beading, or atheromatous changes Left carotid system: No  stenosis, beading, or atheromatous changes Vertebral arteries: Smooth and widely patent. Skeleton: Negative Other neck: No visible inflammation or mass. Upper chest: Negative Review of the MIP images confirms the above findings CTA HEAD FINDINGS Anterior circulation: Vessels are smooth and widely patent. Negative for aneurysm or atheromatous changes Posterior circulation: Vessels are smooth and widely patent.Negative for aneurysm or atheromatous changes Venous sinuses: Diffusely patent Anatomic variants: Negative Review of the MIP images confirms the above findings IMPRESSION: Negative CTA of the head and neck. Electronically Signed   By: Marnee Spring M.D.   On: 08/09/2020 05:55       Subjective: Headache is improving after Toradol, Reglan, and diphenhydramine.  No fever, focal neurological signs.  No loss of consciousness.  No vomiting.  Appetite good.  No active vaginal bleeding.  Discharge Exam: Vitals:   08/09/20 0830 08/09/20  1130  BP: (!) 130/105 133/70  Pulse: 61 71  Resp: 15 13  Temp: 98 F (36.7 C)   SpO2: 100% 99%   Vitals:   08/09/20 0714 08/09/20 0800 08/09/20 0830 08/09/20 1130  BP: 123/83 (!) 136/103 (!) 130/105 133/70  Pulse: 66 60 61 71  Resp: 11 20 15 13   Temp: 97.6 F (36.4 C) 98 F (36.7 C) 98 F (36.7 C)   TempSrc: Oral     SpO2: 100% 97% 100% 99%  Weight:      Height:        General: Pt is alert, awake, not in acute distress Cardiovascular: RRR, nl S1-S2, no murmurs appreciated.   No LE edema.   Respiratory: Normal respiratory rate and rhythm.  CTAB without rales or wheezes. Abdominal: Abdomen soft and non-tender.  No distension or HSM.   Neuro/Psych: Strength symmetric in upper and lower extremities.  Judgment and insight appear normal.   The results of significant diagnostics from this hospitalization (including imaging, microbiology, ancillary and laboratory) are listed below for reference.     Microbiology: Recent Results (from the past 240 hour(s))  SARS CORONAVIRUS 2 (TAT 6-24 HRS) Nasopharyngeal Nasopharyngeal Swab     Status: None   Collection Time: 08/09/20  1:24 AM   Specimen: Nasopharyngeal Swab  Result Value Ref Range Status   SARS Coronavirus 2 NEGATIVE NEGATIVE Final    Comment: (NOTE) SARS-CoV-2 target nucleic acids are NOT DETECTED.  The SARS-CoV-2 RNA is generally detectable in upper and lower respiratory specimens during the acute phase of infection. Negative results do not preclude SARS-CoV-2 infection, do not rule out co-infections with other pathogens, and should not be used as the sole basis for treatment or other patient management decisions. Negative results must be combined with clinical observations, patient history, and epidemiological information. The expected result is Negative.  Fact Sheet for Patients: 08/11/20  Fact Sheet for Healthcare Providers: HairSlick.no  This test  is not yet approved or cleared by the [place] FDA and  has been authorized for detection and/or diagnosis of SARS-CoV-2 by FDA under an Emergency Use Authorization (EUA). This EUA will remain  in effect (meaning this test can be used) for the duration of the COVID-19 declaration under Se ction 564(b)(1) of the Act, 21 U.S.C. section 360bbb-3(b)(1), unless the authorization is terminated or revoked sooner.  Performed at [facility], [address], [place], [place]      Labs: BNP (last 3 results) No results for input(s): BNP in the last 8760 hours. Basic Metabolic Panel: Recent Labs  Lab 08/08/20 2357 08/09/20 0330  NA 136 139  K 3.6 3.1*  CL 105 114*  CO2 26 21*  GLUCOSE 88 77  BUN 7 5*  CREATININE 0.67 0.33*  CALCIUM 8.9 6.9*   Liver Function Tests: No results for input(s): AST, ALT, ALKPHOS, BILITOT, PROT, ALBUMIN in the last 168 hours. No results for input(s): LIPASE, AMYLASE in the last 168 hours. No results for input(s): AMMONIA in the last 168 hours. CBC: Recent Labs  Lab 08/08/20 2357 08/09/20 0330 08/09/20 0934  WBC 6.3 4.5  --   NEUTROABS 2.7  --   --   HGB 6.9* 6.0* 7.6*  HCT 24.3* 21.5* 26.5*  MCV 67.3* 67.4*  --   PLT 297 260  --    Cardiac Enzymes: No results for input(s): CKTOTAL, CKMB, CKMBINDEX, TROPONINI in the last 168 hours. BNP: Invalid input(s): POCBNP CBG: No results for input(s): GLUCAP in the last 168 hours. D-Dimer No results for input(s): DDIMER in the last 72 hours. Hgb A1c No results for input(s): HGBA1C in the last 72 hours. Lipid Profile No results for input(s): CHOL, HDL, LDLCALC, TRIG, CHOLHDL, LDLDIRECT in the last 72 hours. Thyroid function studies No results for input(s): TSH, T4TOTAL, T3FREE, THYROIDAB in the last 72 hours.  Invalid input(s): FREET3 Anemia work up No results for input(s): VITAMINB12, FOLATE, FERRITIN, TIBC, IRON, RETICCTPCT in the last 72 hours. Urinalysis    Component Value  Date/Time   COLORURINE YELLOW 11/26/2016 0202   APPEARANCEUR HAZY (A) 11/26/2016 0202   APPEARANCEUR Clear 07/25/2012 1828   LABSPEC 1.019 11/26/2016 0202   LABSPEC 1.005 07/25/2012 1828   PHURINE 6.0 11/26/2016 0202   GLUCOSEU NEGATIVE 11/26/2016 0202   GLUCOSEU Negative 07/25/2012 1828   HGBUR NEGATIVE 11/26/2016 0202   BILIRUBINUR NEGATIVE 11/26/2016 0202   BILIRUBINUR Negative 07/25/2012 1828   KETONESUR NEGATIVE 11/26/2016 0202   PROTEINUR NEGATIVE 11/26/2016 0202   UROBILINOGEN 0.2 05/06/2013 0954   NITRITE NEGATIVE 11/26/2016 0202   LEUKOCYTESUR NEGATIVE 11/26/2016 0202   LEUKOCYTESUR Negative 07/25/2012 1828   Sepsis Labs Invalid input(s): PROCALCITONIN,  WBC,  LACTICIDVEN Microbiology Recent Results (from the past 240 hour(s))  SARS CORONAVIRUS 2 (TAT 6-24 HRS) Nasopharyngeal Nasopharyngeal Swab     Status: None   Collection Time: 08/09/20  1:24 AM   Specimen: Nasopharyngeal Swab  Result Value Ref Range Status   SARS Coronavirus 2 NEGATIVE NEGATIVE Final    Comment: (NOTE) SARS-CoV-2 target nucleic acids are NOT DETECTED.  The SARS-CoV-2 RNA is generally detectable in upper and lower respiratory specimens during the acute phase of infection. Negative results do not preclude SARS-CoV-2 infection, do not rule out co-infections with other pathogens, and should not be used as the sole basis for treatment or other patient management decisions. Negative results must be combined with clinical observations, patient history, and epidemiological information. The expected result is Negative.  Fact Sheet for Patients: HairSlick.no  Fact Sheet for Healthcare Providers: quierodirigir.com  This test is not yet approved or cleared by the [place] FDA and  has been authorized for detection and/or diagnosis of SARS-CoV-2 by FDA under an Emergency Use Authorization (EUA). This EUA will remain  in effect (meaning this  test can be used) for the duration of the COVID-19 declaration under Se ction 564(b)(1) of the Act, 21 U.S.C. section 360bbb-3(b)(1), unless the authorization is terminated or revoked sooner.  Performed at [facility], [address] [place], [place] [address]      Time coordinating discharge: 90 minutes    30 Day Unplanned Readmission Risk  Score   Flowsheet Row ED from 08/08/2020 in [facility]  30 Day Unplanned Readmission Risk Score (%) 8.9 Filed at 08/09/2020 1200     This score is the patient's risk of an unplanned readmission within 30 days of being discharged (0 -100%). The score is based on dignosis, age, lab data, medications, orders, and past utilization.   Low:  0-14.9   Medium: 15-21.9   High: 22-29.9   Extreme: 30 and above           SIGNED:   Alberteen Sam, MD  [facility] 08/09/2020, 1:04 PM

## 2020-08-09 NOTE — ED Provider Notes (Signed)
[facility]-EMERGENCY DEPT Provider Note   CSN: [id number] Arrival date & time: 08/08/20  1950     History Chief Complaint  Patient presents with  . Headache    Sara Contreras is a 35 y.o. female.  Patient with history of obesity, asthma, "migraines" here with persistent headaches.  States has had problems with headaches ever since she fell and hit her head in March.  Over the past 3 days however her pain has become more severe.  She describes a diffuse headache that is gradually and progressively worsening.  Denies thunderclap onset but states it is quite severe.  Does have some photophobia no phonophobia.  No nausea or vomiting.  No fever.  No focal weakness, numbness or tingling.  No chest pain or shortness of breath.  States she has seen a neurologist in the past but never had any kind of imaging of her brain.  Does not carry an official diagnosis of migraines.  She is taking Tylenol at home without relief.  States she been having headaches intermittently ever since she hit her head in March but over the past 3 days it has become more severe. States the headaches are mostly on the right side of her head but progressively worsening and was not thunderclap in origin.  The history is provided by the patient.  Headache Associated symptoms: photophobia   Associated symptoms: no abdominal pain, no congestion, no fatigue, no fever, no myalgias, no nausea, no vomiting and no weakness        Past Medical History:  Diagnosis Date  . Abnormal EKG   . Anxiety   . Asthma   . Gastric ulcer   . Migraines   . Morbid obesity (HCC)   . Pancreatitis     Patient Active Problem List   Diagnosis Date Noted  . Abnormal LFTs   . Acute pancreatitis 06/24/2016    Past Surgical History:  Procedure Laterality Date  . LAPAROSCOPIC GASTRIC SLEEVE RESECTION  2016     OB History    Gravida  1   Para  1   Term  1   Preterm      AB      Living        SAB       IAB      Ectopic      Multiple      Live Births              Family History  Problem Relation Age of Onset  . Hypertension Other   . Diabetes Other     Social History   Tobacco Use  . Smoking status: Current Some Day Smoker    Types: Cigarettes  . Smokeless tobacco: Never Used  . Tobacco comment: rarely smokes  Substance Use Topics  . Alcohol use: Yes    Comment: occassional  . Drug use: No    Home Medications Prior to Admission medications   Medication Sig Start Date End Date Taking? Authorizing Provider  albuterol (PROVENTIL HFA;VENTOLIN HFA) 108 (90 BASE) MCG/ACT inhaler Inhale 1-2 puffs into the lungs every 6 (six) hours as needed for wheezing or shortness of breath.     [provider]  albuterol (PROVENTIL) (5 MG/ML) 0.5% nebulizer solution Take 2.5 mg by nebulization every 6 (six) hours as needed for wheezing or shortness of breath.    [provider]  BIOTIN PO Take 1 tablet by mouth daily.    [provider]  diazepam (  VALIUM) 5 MG tablet Take 5 mg by mouth 2 (two) times daily as needed for anxiety.  04/24/16   [provider]  escitalopram (LEXAPRO) 20 MG tablet Take 20 mg by mouth daily. 06/19/16   [provider]  HYDROcodone-acetaminophen (NORCO/VICODIN) 5-325 MG tablet Take 1-2 tablets by mouth every 4 (four) hours as needed. 02/20/17   Rise Mu, PA-C  morphine (MSIR) 15 MG tablet Take 1 tablet (15 mg total) by mouth every 6 (six) hours as needed for severe pain. Patient not taking: Reported on 02/20/2017 11/26/16   Michela Pitcher A, PA-C  Multiple Vitamins-Minerals (MULTIVITAMIN ADULTS PO) Take 1 tablet by mouth daily.    [provider]  ondansetron (ZOFRAN-ODT) 4 MG disintegrating tablet Take 1 tablet (4 mg total) by mouth every 8 (eight) hours as needed for nausea. 02/20/17   Rise Mu, PA-C  pantoprazole (PROTONIX) 40 MG tablet Take 40 mg by mouth daily. 11/12/16   [provider]   potassium chloride (K-DUR) 10 MEQ tablet Take 1 tablet (10 mEq total) by mouth daily. Patient not taking: Reported on 11/26/2016 06/28/16   Alison Murray, MD  tiZANidine (ZANAFLEX) 4 MG tablet Take 4 mg by mouth every 8 (eight) hours as needed for muscle spasms.    [provider]    Allergies    Methylene blue and Contrast media [iodinated diagnostic agents]  Review of Systems   Review of Systems  Constitutional: Negative for activity change, appetite change, fatigue and fever.  HENT: Negative for congestion and rhinorrhea.   Eyes: Positive for photophobia.  Respiratory: Negative for chest tightness.   Cardiovascular: Negative for chest pain.  Gastrointestinal: Negative for abdominal pain, nausea and vomiting.  Genitourinary: Negative for dysuria and hematuria.  Musculoskeletal: Negative for arthralgias and myalgias.  Skin: Negative for rash.  Neurological: Positive for headaches. Negative for weakness.   all other systems are negative except as noted in the HPI and PMH.    Physical Exam Updated Vital Signs BP (!) 149/109   Pulse 64   Temp 98.2 F (36.8 C) (Oral)   Resp 16   LMP 08/01/2020 (Approximate)   SpO2 100%   Physical Exam Vitals and nursing note reviewed.  Constitutional:      General: She is not in acute distress.    Appearance: She is well-developed. She is obese.  HENT:     Head: Normocephalic and atraumatic.     Mouth/Throat:     Pharynx: No oropharyngeal exudate.  Eyes:     Conjunctiva/sclera: Conjunctivae normal.     Pupils: Pupils are equal, round, and reactive to light.  Neck:     Comments: No meningismus. Cardiovascular:     Rate and Rhythm: Normal rate and regular rhythm.     Heart sounds: Normal heart sounds. No murmur heard.   Pulmonary:     Effort: Pulmonary effort is normal. No respiratory distress.     Breath sounds: Normal breath sounds.  Abdominal:     Palpations: Abdomen is soft.     Tenderness: There is no abdominal  tenderness. There is no guarding or rebound.  Musculoskeletal:        General: No tenderness. Normal range of motion.     Cervical back: Normal range of motion and neck supple.  Skin:    General: Skin is warm.  Neurological:     Mental Status: She is alert and oriented to person, place, and time.     Cranial Nerves: No cranial nerve  deficit.     Motor: No abnormal muscle tone.     Coordination: Coordination normal.     Comments:  5/5 strength throughout. CN 2-12 intact.Equal grip strength.   Psychiatric:        Behavior: Behavior normal.     ED Results / Procedures / Treatments   Labs (all labs ordered are listed, but only abnormal results are displayed) Labs Reviewed  CBC WITH DIFFERENTIAL/PLATELET - Abnormal; Notable for the following components:      Result Value   RBC 3.61 (*)    Hemoglobin 6.9 (*)    HCT 24.3 (*)    MCV 67.3 (*)    MCH 19.1 (*)    MCHC 28.4 (*)    RDW 21.2 (*)    All other components within normal limits  BASIC METABOLIC PANEL - Abnormal; Notable for the following components:   Potassium 3.1 (*)    Chloride 114 (*)    CO2 21 (*)    BUN 5 (*)    Creatinine, Ser 0.33 (*)    Calcium 6.9 (*)    Anion gap 4 (*)    All other components within normal limits  CBC - Abnormal; Notable for the following components:   RBC 3.19 (*)    Hemoglobin 6.0 (*)    HCT 21.5 (*)    MCV 67.4 (*)    MCH 18.8 (*)    MCHC 27.9 (*)    RDW 21.2 (*)    All other components within normal limits  SARS CORONAVIRUS 2 (TAT 6-24 HRS)  BASIC METABOLIC PANEL  HIV ANTIBODY (ROUTINE TESTING W REFLEX)  HEMOGLOBIN AND HEMATOCRIT, BLOOD  HEMOGLOBIN AND HEMATOCRIT, BLOOD  HEMOGLOBIN AND HEMATOCRIT, BLOOD  I-STAT BETA HCG BLOOD, ED ([facility], [facility], [facility] ONLY)  POC OCCULT BLOOD, ED  TYPE AND SCREEN  PREPARE RBC (CROSSMATCH)    EKG None  Radiology CT Angio Head W or Wo Contrast  Result Date: 08/09/2020 CLINICAL DATA:  Intermittent headaches since fall. Headache for 3 days. EXAM: CT  ANGIOGRAPHY HEAD AND NECK TECHNIQUE: Multidetector CT imaging of the head and neck was performed using the standard protocol during bolus administration of intravenous contrast. Multiplanar CT image reconstructions and MIPs were obtained to evaluate the vascular anatomy. Carotid stenosis measurements (when applicable) are obtained utilizing NASCET criteria, using the distal internal carotid diameter as the denominator. CONTRAST:  80mL OMNIPAQUE IOHEXOL 350 MG/ML SOLN COMPARISON:  07/28/2015 FINDINGS: CT HEAD FINDINGS Brain: No evidence of acute infarction, hemorrhage, hydrocephalus, extra-axial collection or mass lesion/mass effect. Vascular: See below Skull: Normal. Negative for fracture or focal lesion. Sinuses: Clear Orbits: Negative Review of the MIP images confirms the above findings CTA NECK FINDINGS Aortic arch: Normal where covered. Right carotid system: No stenosis, beading, or atheromatous changes Left carotid system: No stenosis, beading, or atheromatous changes Vertebral arteries: Smooth and widely patent. Skeleton: Negative Other neck: No visible inflammation or mass. Upper chest: Negative Review of the MIP images confirms the above findings CTA HEAD FINDINGS Anterior circulation: Vessels are smooth and widely patent. Negative for aneurysm or atheromatous changes Posterior circulation: Vessels are smooth and widely patent.Negative for aneurysm or atheromatous changes Venous sinuses: Diffusely patent Anatomic variants: Negative Review of the MIP images confirms the above findings IMPRESSION: Negative CTA of the head and neck. Electronically Signed   By: Marnee SpringJonathon  Watts M.D.   On: 08/09/2020 05:55   CT Angio Neck W and/or Wo Contrast  Result Date: 08/09/2020 CLINICAL DATA:  Intermittent headaches since fall. Headache  for 3 days. EXAM: CT ANGIOGRAPHY HEAD AND NECK TECHNIQUE: Multidetector CT imaging of the head and neck was performed using the standard protocol during bolus administration of  intravenous contrast. Multiplanar CT image reconstructions and MIPs were obtained to evaluate the vascular anatomy. Carotid stenosis measurements (when applicable) are obtained utilizing NASCET criteria, using the distal internal carotid diameter as the denominator. CONTRAST:  54mL OMNIPAQUE IOHEXOL 350 MG/ML SOLN COMPARISON:  07/28/2015 FINDINGS: CT HEAD FINDINGS Brain: No evidence of acute infarction, hemorrhage, hydrocephalus, extra-axial collection or mass lesion/mass effect. Vascular: See below Skull: Normal. Negative for fracture or focal lesion. Sinuses: Clear Orbits: Negative Review of the MIP images confirms the above findings CTA NECK FINDINGS Aortic arch: Normal where covered. Right carotid system: No stenosis, beading, or atheromatous changes Left carotid system: No stenosis, beading, or atheromatous changes Vertebral arteries: Smooth and widely patent. Skeleton: Negative Other neck: No visible inflammation or mass. Upper chest: Negative Review of the MIP images confirms the above findings CTA HEAD FINDINGS Anterior circulation: Vessels are smooth and widely patent. Negative for aneurysm or atheromatous changes Posterior circulation: Vessels are smooth and widely patent.Negative for aneurysm or atheromatous changes Venous sinuses: Diffusely patent Anatomic variants: Negative Review of the MIP images confirms the above findings IMPRESSION: Negative CTA of the head and neck. Electronically Signed   By: Marnee Spring M.D.   On: 08/09/2020 05:55    Procedures .Critical Care Performed by: Glynn Octave, MD Authorized by: Glynn Octave, MD   Critical care provider statement:    Critical care time (minutes):  45   Critical care was necessary to treat or prevent imminent or life-threatening deterioration of the following conditions: anemia requiring transfusion.   Critical care was time spent personally by me on the following activities:  Discussions with consultants, evaluation of patient's  response to treatment, examination of patient, ordering and performing treatments and interventions, ordering and review of laboratory studies, ordering and review of radiographic studies, pulse oximetry, re-evaluation of patient's condition, obtaining history from patient or surrogate and review of old charts     Medications Ordered in ED Medications  sodium chloride 0.9 % bolus 1,000 mL (has no administration in time range)  ondansetron (ZOFRAN) injection 4 mg (has no administration in time range)  fentaNYL (SUBLIMAZE) injection 50 mcg (has no administration in time range)  hydrocortisone sodium succinate (SOLU-CORTEF) injection 200 mg (has no administration in time range)  diphenhydrAMINE (BENADRYL) capsule 50 mg (has no administration in time range)    Or  diphenhydrAMINE (BENADRYL) injection 50 mg (has no administration in time range)    ED Course  I have reviewed the triage vital signs and the nursing notes.  Pertinent labs & imaging results that were available during my care of the patient were reviewed by me and considered in my medical decision making (see chart for details).    MDM Rules/Calculators/A&P                         3 days of progressively worsening diffuse headache.  Denies thunderclap onset.  Neurologically intact.  Has had issues with headaches ever since she had an injury in March.  Blood pressure mildly elevated today with no history of the same.  Low suspicion for subarachnoid hemorrhage, meningitis, temporal arteritis.  We will proceed with imaging as patient has not had this previously.  She does report a IV contrast allergy causing hives but is willing to try premedication.  Hemoglobin found to be  6.9.  Comparison values in 2018 or 9-10 range.  Patient agreeable to blood transfusion. Does admit to heavy menses.  No black or bloody stools  Patient agreeable to blood transfusion.  Her Hemoccult is negative.  Remains hemodynamically stable.  CTA shows no  evidence of aneurysm or vascular abnormality. Headache has improved with fluids, Reglan, Benadryl and Toradol.   Admission for her severe anemia will be planned.  Discussed with Dr. Arville Care.  Her anemia may as well be contributing to her headache.  Final Clinical Impression(s) / ED Diagnoses Final diagnoses:  None    Rx / DC Orders ED Discharge Orders    None       Kartik Fernando, Jeannett Senior, MD 08/09/20 [phone]

## 2020-08-09 NOTE — H&P (Addendum)
[facility]   PATIENT NAME: Sara Contreras    MR#:  [id number]  DATE OF BIRTH:  1986/03/01  DATE OF ADMISSION:  08/08/2020  PRIMARY CARE PHYSICIAN: Georgianne Fick, MD   Patient is coming from: Home  REQUESTING/REFERRING PHYSICIAN:Rancour, Jeannett Senior, MD  CHIEF COMPLAINT:   Chief Complaint  Patient presents with  . Headache    HISTORY OF PRESENT ILLNESS:  Sara Contreras is a 35 y.o. African-American female with medical history significant for anxiety, asthma, migraine, anxiety and gastric ulcer, who presented to the emergency room with acute onset of worsening headache since March 2022 with associated menorrhagia recently.  She denied thunderclap headache. She has paresthesias with tingling in both hands.  She admitted to photophobia with her headache. No fever or chills.  No nausea or vomiting or abdominal pain however she has been having heartburn.  Has been having occasional diarrhea.  She admitted to chills without fever.  No dysuria, oliguria or hematuria or flank pain.  She stated that she fell in March and hit her head no dysuria, oliguria or hematuria or flank pain.  No other bleeding diathesis.  No chest pain or palpitations or cough or wheezing or hemoptysis.  No bleeding diathesis.  No dysuria, oliguria or hematuria or flank pain.    ED Course: When the patient came to the ER blood pressure was 138/95 with otherwise normal vital signs.  Labs revealed significant anemia.  Hemoglobin was 6.9 hematocrit 24 3 with low RBC indices.  Platelets were 297 and WBC 6.3.  BMP was normal.  Quantitative hCG came back negative.   Imaging: Abdominal pelvic CT scan without contrast revealed slight hazy appearance of the pancreas and postsurgical changes of gastric sleeve with no bowel obstruction and normal appendix.  The patient was given 4 mg of IV Zofran and 50 mcg of IV fentanyl to help with pain.  Decision was made to order a head and neck CTA and given her contrast allergy she  was given 200 mg of IV Solu-Cortef Cortef and 50 mg of p.o. and 50 mg of IV fentanyl as well as 1 L bolus of IV normal saline.  She will be admitted to a telemetry bed for further evaluation and management. PAST MEDICAL HISTORY:   Past Medical History:  Diagnosis Date  . Abnormal EKG   . Anxiety   . Asthma   . Gastric ulcer   . Migraines   . Morbid obesity (HCC)   . Pancreatitis     PAST SURGICAL HISTORY:   Past Surgical History:  Procedure Laterality Date  . LAPAROSCOPIC GASTRIC SLEEVE RESECTION  2016    SOCIAL HISTORY:   Social History   Tobacco Use  . Smoking status: Current Some Day Smoker    Types: Cigarettes  . Smokeless tobacco: Never Used  . Tobacco comment: rarely smokes  Substance Use Topics  . Alcohol use: Yes    Comment: occassional    FAMILY HISTORY:   Family History  Problem Relation Age of Onset  . Hypertension Other   . Diabetes Other   Positive for diabetes mellitus, hypertension, cancer, MI and CVA.  DRUG ALLERGIES:   Allergies  Allergen Reactions  . Methylene Blue Hives  . Contrast Media [Iodinated Diagnostic Agents] Hives    REVIEW OF SYSTEMS:   ROS As per history of present illness. All pertinent systems were reviewed above. Constitutional, HEENT, cardiovascular, respiratory, GI, GU, musculoskeletal, neuro, psychiatric, endocrine, integumentary and hematologic systems were reviewed and are  otherwise negative/unremarkable except for positive findings mentioned above in the HPI.   MEDICATIONS AT HOME:   Prior to Admission medications   Medication Sig Start Date End Date Taking? Authorizing Provider  albuterol (PROVENTIL HFA;VENTOLIN HFA) 108 (90 BASE) MCG/ACT inhaler Inhale 1-2 puffs into the lungs every 6 (six) hours as needed for wheezing or shortness of breath.     [provider]  albuterol (PROVENTIL) (5 MG/ML) 0.5% nebulizer solution Take 2.5 mg by nebulization every 6 (six) hours as needed for wheezing or shortness of  breath.    [provider]  BIOTIN PO Take 1 tablet by mouth daily.    [provider]  diazepam (VALIUM) 5 MG tablet Take 5 mg by mouth 2 (two) times daily as needed for anxiety.  04/24/16   [provider]  escitalopram (LEXAPRO) 20 MG tablet Take 20 mg by mouth daily. 06/19/16   [provider]  HYDROcodone-acetaminophen (NORCO/VICODIN) 5-325 MG tablet Take 1-2 tablets by mouth every 4 (four) hours as needed. 02/20/17   Rise Mu, PA-C  morphine (MSIR) 15 MG tablet Take 1 tablet (15 mg total) by mouth every 6 (six) hours as needed for severe pain. Patient not taking: Reported on 02/20/2017 11/26/16   Michela Pitcher A, PA-C  Multiple Vitamins-Minerals (MULTIVITAMIN ADULTS PO) Take 1 tablet by mouth daily.    [provider]  ondansetron (ZOFRAN-ODT) 4 MG disintegrating tablet Take 1 tablet (4 mg total) by mouth every 8 (eight) hours as needed for nausea. 02/20/17   Rise Mu, PA-C  pantoprazole (PROTONIX) 40 MG tablet Take 40 mg by mouth daily. 11/12/16   [provider]  potassium chloride (K-DUR) 10 MEQ tablet Take 1 tablet (10 mEq total) by mouth daily. Patient not taking: Reported on 11/26/2016 06/28/16   Alison Murray, MD  tiZANidine (ZANAFLEX) 4 MG tablet Take 4 mg by mouth every 8 (eight) hours as needed for muscle spasms.    [provider]      VITAL SIGNS:  Blood pressure (!) 141/99, pulse (!) 56, temperature 98.2 F (36.8 C), temperature source Oral, resp. rate 18, last menstrual period 08/01/2020, SpO2 100 %.  PHYSICAL EXAMINATION:  Physical Exam  GENERAL:  35 y.o.-year-old African-American female patient lying in the bed with no acute distress.  EYES: Pupils equal, round, reactive to light and accommodation. No scleral icterus.  Positive pallor.  Extraocular muscles intact.  HEENT: Head atraumatic, normocephalic. Oropharynx and nasopharynx clear.  NECK:  Supple, no jugular venous distention. No thyroid  enlargement, no tenderness.  LUNGS: Normal breath sounds bilaterally, no wheezing, rales,rhonchi or crepitation. No use of accessory muscles of respiration.  CARDIOVASCULAR: Regular rate and rhythm, S1, S2 normal. No murmurs, rubs, or gallops.  ABDOMEN: Soft, nondistended, nontender. Bowel sounds present. No organomegaly or mass.  EXTREMITIES: No pedal edema, cyanosis, or clubbing.  NEUROLOGIC: Cranial nerves II through XII are intact. Muscle strength 5/5 in all extremities. Sensation intact. Gait not checked.  PSYCHIATRIC: The patient is alert and oriented x 3.  Normal affect and good eye contact. SKIN: No obvious rash, lesion, or ulcer.   LABORATORY PANEL:   CBC Recent Labs  Lab 08/08/20 2357  WBC 6.3  HGB 6.9*  HCT 24.3*  PLT 297   ------------------------------------------------------------------------------------------------------------------  Chemistries  Recent Labs  Lab 08/08/20 2357  NA 136  K 3.6  CL 105  CO2 26  GLUCOSE 88  BUN 7  CREATININE 0.67  CALCIUM 8.9   ------------------------------------------------------------------------------------------------------------------  Cardiac  Enzymes No results for input(s): TROPONINI in the last 168 hours. ------------------------------------------------------------------------------------------------------------------  RADIOLOGY:  No results found.    IMPRESSION AND PLAN:  Active Problems:   Acute blood loss anemia  1.  Acute blood loss anemia secondary to menorrhagia with recent menometrorrhagia. - The patient was admitted to a telemetry bed. - She was typed and crossmatch and will be transfused units of packed red blood cells. - We will follow posttransfusion H&H. - Routine GYN consultation can be obtained here and called in a.m.  2.  Headache. - A head and neck CTA is ordered and is pending. - The patient was premedicated for IV contrast allergy. - With persistent headache neurology consultation can be  called in a.m.  3.  Depression and anxiety. - We will continue Lexapro and Wellbutrin XL as well as Valium.  4.  GERD. - We will continue PPI therapy.  DVT prophylaxis: SCDs.  Code Status: full code.   Family Communication:  The plan of care was discussed in details with the patient (and family). I answered all questions. The patient agreed to proceed with the above mentioned plan. Further management will depend upon hospital course. Disposition Plan: Back to previous home environment Consults called: GYN consult can be obtained in a.m. All the records are reviewed and case discussed with ED provider.  Status is: Inpatient  Remains inpatient appropriate because:Ongoing active pain requiring inpatient pain management, Ongoing diagnostic testing needed not appropriate for outpatient work up, Unsafe d/c plan, IV treatments appropriate due to intensity of illness or inability to take PO and Inpatient level of care appropriate due to severity of illness   Dispo: The patient is from: Home              Anticipated d/c is to: Home              Patient currently is not medically stable to d/c.   Difficult to place patient No    TOTAL TIME TAKING CARE OF THIS PATIENT: 5 minutes.    Hannah Beat M.D on 08/09/2020 at 2:41 AM  [facility]   From 7 PM-7 AM, contact night-coverage [email or web address]  CC: Primary care physician; Georgianne Fick, MD

## 2020-08-12 LAB — TYPE AND SCREEN: Antibody Screen: NEGATIVE

## 2020-08-13 LAB — BPAM RBC
Blood Product Expiration Date: 202206282359
Blood Product Expiration Date: 202206282359
ISSUE DATE / TIME: 202205270550
Unit Type and Rh: 5100
Unit Type and Rh: 5100

## 2020-08-13 LAB — TYPE AND SCREEN
ABO/RH(D): O POS
Unit division: 0
Unit division: 0

## 2020-08-14 ENCOUNTER — Encounter (HOSPITAL_COMMUNITY): Payer: Self-pay

## 2020-08-14 ENCOUNTER — Emergency Department (HOSPITAL_COMMUNITY)
Admission: EM | Admit: 2020-08-14 | Discharge: 2020-08-14 | Disposition: A | Discharge status: Home / Self Care | Payer: Medicaid Other | Attending: Emergency Medicine | Admitting: Emergency Medicine

## 2020-08-14 ENCOUNTER — Other Ambulatory Visit: Payer: Self-pay

## 2020-08-14 ENCOUNTER — Emergency Department (HOSPITAL_COMMUNITY): Payer: Medicaid Other

## 2020-08-14 DIAGNOSIS — R42 Dizziness and giddiness: Secondary | ICD-10-CM | POA: Insufficient documentation

## 2020-08-14 DIAGNOSIS — D649 Anemia, unspecified: Secondary | ICD-10-CM

## 2020-08-14 DIAGNOSIS — R519 Headache, unspecified: Secondary | ICD-10-CM | POA: Diagnosis not present

## 2020-08-14 DIAGNOSIS — J45909 Unspecified asthma, uncomplicated: Secondary | ICD-10-CM | POA: Insufficient documentation

## 2020-08-14 DIAGNOSIS — R0602 Shortness of breath: Secondary | ICD-10-CM | POA: Insufficient documentation

## 2020-08-14 DIAGNOSIS — R0789 Other chest pain: Secondary | ICD-10-CM | POA: Diagnosis not present

## 2020-08-14 DIAGNOSIS — H53149 Visual discomfort, unspecified: Secondary | ICD-10-CM | POA: Insufficient documentation

## 2020-08-14 DIAGNOSIS — F17210 Nicotine dependence, cigarettes, uncomplicated: Secondary | ICD-10-CM | POA: Diagnosis not present

## 2020-08-14 DIAGNOSIS — F419 Anxiety disorder, unspecified: Secondary | ICD-10-CM | POA: Insufficient documentation

## 2020-08-14 LAB — COMPREHENSIVE METABOLIC PANEL
ALT: 11 U/L (ref 0–44)
AST: 20 U/L (ref 15–41)
Albumin: 3.6 g/dL (ref 3.5–5.0)
Alkaline Phosphatase: 54 U/L (ref 38–126)
Anion gap: 7 (ref 5–15)
BUN: 10 mg/dL (ref 6–20)
CO2: 26 mmol/L (ref 22–32)
Calcium: 8.9 mg/dL (ref 8.9–10.3)
Chloride: 102 mmol/L (ref 98–111)
Creatinine, Ser: 0.64 mg/dL (ref 0.44–1.00)
GFR, Estimated: >60 mL/min (ref >60)
Glucose, Bld: 88 mg/dL (ref 70–99)
Potassium: 3.9 mmol/L (ref 3.5–5.1)
Sodium: 135 mmol/L (ref 135–145)
Total Bilirubin: 1.1 mg/dL (ref 0.3–1.2)
Total Protein: 7.5 g/dL (ref 6.5–8.1)

## 2020-08-14 LAB — CBC
HCT: 27.9 % — ABNORMAL LOW (ref 36.0–46.0)
Hemoglobin: 8.1 g/dL — ABNORMAL LOW (ref 12.0–15.0)
MCH: 19.7 pg — ABNORMAL LOW (ref 26.0–34.0)
MCHC: 29 g/dL — ABNORMAL LOW (ref 30.0–36.0)
MCV: 67.7 fL — ABNORMAL LOW (ref 80.0–100.0)
Platelets: 277 10*3/uL (ref 150–400)
RBC: 4.12 MIL/uL (ref 3.87–5.11)
RDW: 22.9 % — ABNORMAL HIGH (ref 11.5–15.5)
WBC: 4.9 10*3/uL (ref 4.0–10.5)
nRBC: 0 % (ref 0.0–0.2)

## 2020-08-14 LAB — I-STAT BETA HCG BLOOD, ED (MC, WL, AP ONLY): I-stat hCG, quantitative: <5 m[IU]/mL (ref <5)

## 2020-08-14 LAB — POC OCCULT BLOOD, ED: Fecal Occult Bld: NEGATIVE

## 2020-08-14 LAB — TYPE AND SCREEN
ABO/RH(D): O POS
Antibody Screen: NEGATIVE

## 2020-08-14 LAB — TROPONIN I (HIGH SENSITIVITY): Troponin I (High Sensitivity): <2 ng/L (ref <18)

## 2020-08-14 MED ORDER — SODIUM CHLORIDE 0.9 % IV BOLUS
1000.0000 mL | Freq: Once | INTRAVENOUS | Status: AC
  Administered 2020-08-14: 1000 mL via INTRAVENOUS

## 2020-08-14 MED ORDER — KETOROLAC TROMETHAMINE 30 MG/ML IJ SOLN: 30.0000 mg | Freq: Once | INTRAMUSCULAR | Status: DC

## 2020-08-14 MED ORDER — KETOROLAC TROMETHAMINE 30 MG/ML IJ SOLN
30.0000 mg | Freq: Once | INTRAMUSCULAR | Status: AC
  Administered 2020-08-14: 30 mg via INTRAVENOUS
  Filled 2020-08-14: qty 1

## 2020-08-14 MED ORDER — METOCLOPRAMIDE HCL 5 MG/ML IJ SOLN
10.0000 mg | Freq: Once | INTRAMUSCULAR | Status: AC
  Administered 2020-08-14: 10 mg via INTRAVENOUS
  Filled 2020-08-14: qty 2

## 2020-08-14 MED ORDER — POTASSIUM CHLORIDE CRYS ER 20 MEQ PO TBCR: 40.0000 meq | EXTENDED_RELEASE_TABLET | Freq: Once | ORAL | Status: DC

## 2020-08-14 NOTE — ED Triage Notes (Signed)
Pt arrived via POV, c/o headache, dizziness, SOB that started last night. States this happened recently and she wanted to make sure she did not need another blood transfusion.

## 2020-08-14 NOTE — Discharge Instructions (Addendum)
You were seen today for shortness of breath, headache and concerns for anemia requiring blood transfusion. Your hemoglobin is still low, but it is improved from when you were discharged from the hospital. Continue taking your iron supplements and follow up with OBGYN in the next week at your appointment. Please call to the neurologist and your PCP to set follow appointments as directed by the internal medicine doctor you saw last week. If you experience bloody stool, fever, or chest pain please return for further evaluation.

## 2020-08-14 NOTE — ED Provider Notes (Addendum)
[facility]-EMERGENCY DEPT Provider Note   CSN: [id number] Arrival date & time: 08/14/20  1030     History Chief Complaint  Patient presents with  . Shortness of Breath  . Dizziness    Sara Contreras is a 35 y.o. female.  HPI   Patient presents with headache, dizziness and SOB x 1 day. Headache is constant, associated with photophobia without nausea or vomiting. No head trauma. She also reports chest tightness which could be anxiety. She was worried about her blood pressure as well. States she was seen 08/08/20 for similar complaints and she had to have a blood transfusion at that time. She is worried that she will need to again.  She is not having bloody stool, diarrhea.   Past Medical History:  Diagnosis Date  . Abnormal EKG   . Anxiety   . Asthma   . Gastric ulcer   . Migraines   . Morbid obesity (HCC)   . Pancreatitis     Patient Active Problem List   Diagnosis Date Noted  . Acute blood loss anemia 08/09/2020  . Abnormal LFTs   . Acute pancreatitis 06/24/2016    Past Surgical History:  Procedure Laterality Date  . LAPAROSCOPIC GASTRIC SLEEVE RESECTION  2016     OB History    Gravida  1   Para  1   Term  1   Preterm      AB      Living        SAB      IAB      Ectopic      Multiple      Live Births              Family History  Problem Relation Age of Onset  . Hypertension Other   . Diabetes Other     Social History   Tobacco Use  . Smoking status: Current Some Day Smoker    Types: Cigarettes  . Smokeless tobacco: Never Used  . Tobacco comment: rarely smokes  Substance Use Topics  . Alcohol use: Yes    Comment: occassional  . Drug use: No    Home Medications Prior to Admission medications   Medication Sig Start Date End Date Taking? Authorizing Provider  acetaminophen (TYLENOL) 500 MG tablet Take 1-2 tablets (500-1,000 mg total) by mouth daily as needed for headache. 08/09/20   Danford, Earl Lites,  MD  albuterol (PROVENTIL HFA;VENTOLIN HFA) 108 (90 BASE) MCG/ACT inhaler Inhale 1-2 puffs into the lungs every 6 (six) hours as needed for wheezing or shortness of breath.     [provider]  albuterol (PROVENTIL) (5 MG/ML) 0.5% nebulizer solution Take 2.5 mg by nebulization every 6 (six) hours as needed for wheezing or shortness of breath.    [provider]  buPROPion (WELLBUTRIN XL) 150 MG 24 hr tablet Take 150 tablets by mouth daily. 03/13/20   [provider]  ferrous sulfate 325 (65 FE) MG EC tablet Take 1 tablet (325 mg total) by mouth 2 (two) times daily. 08/09/20 08/09/21  Danford, Earl Lites, MD  hydrOXYzine (VISTARIL) 25 MG capsule Take 25 mg by mouth every 4 (four) hours as needed for anxiety. 08/06/20   [provider]  naproxen sodium (ALEVE) 220 MG tablet Take 1 tablet (220 mg total) by mouth 2 (two) times daily with a meal. 08/09/20   Danford, Earl Lites, MD  ondansetron (ZOFRAN-ODT) 4 MG disintegrating tablet Take 1 tablet (4 mg total) by  mouth every 8 (eight) hours as needed for nausea. Patient not taking: No sig reported 02/20/17   Demetrios Loll T, PA-C  potassium chloride (K-DUR) 10 MEQ tablet Take 1 tablet (10 mEq total) by mouth daily. Patient not taking: No sig reported 06/28/16   Alison Murray, MD    Allergies    Methylene blue and Contrast media [iodinated diagnostic agents]  Review of Systems   Review of Systems  Constitutional: Negative for chills and fever.  HENT: Negative for ear pain and sore throat.   Eyes: Negative for photophobia, pain and visual disturbance.  Respiratory: Positive for chest tightness and shortness of breath. Negative for cough.   Cardiovascular: Negative for chest pain, palpitations and leg swelling.  Gastrointestinal: Negative for abdominal pain and vomiting.  Genitourinary: Negative for dysuria and hematuria.  Musculoskeletal: Negative for arthralgias and back pain.  Skin: Negative for color  change and rash.  Neurological: Positive for dizziness, light-headedness and headaches. Negative for seizures and syncope.  All other systems reviewed and are negative.   Physical Exam Updated Vital Signs BP (!) 140/106 (BP Location: Right Arm)   Pulse 71   Temp 98.5 F (36.9 C) (Oral)   Resp 13   LMP 08/01/2020 (Approximate)   SpO2 100%   Physical Exam Vitals and nursing note reviewed. Exam conducted with a chaperone present.  Constitutional:      Appearance: Normal appearance. She is obese.  HENT:     Head: Normocephalic and atraumatic.  Eyes:     General: No scleral icterus.       Right eye: No discharge.        Left eye: No discharge.     Extraocular Movements: Extraocular movements intact.     Pupils: Pupils are equal, round, and reactive to light.  Cardiovascular:     Rate and Rhythm: Normal rate and regular rhythm.     Pulses: Normal pulses.     Heart sounds: Normal heart sounds. No murmur heard. No friction rub. No gallop.   Pulmonary:     Effort: Pulmonary effort is normal. No respiratory distress.     Breath sounds: Normal breath sounds.     Comments: Speaking in complete sentences. Lungs CTA bilaterally.  Chest:     Chest wall: No mass or deformity.  Abdominal:     General: Abdomen is flat. Bowel sounds are normal. There is no distension.     Palpations: Abdomen is soft.     Tenderness: There is no abdominal tenderness.  Musculoskeletal:        General: Normal range of motion.     Right lower leg: No tenderness. No edema.     Left lower leg: No tenderness. No edema.  Skin:    General: Skin is warm and dry.     Coloration: Skin is not jaundiced.  Neurological:     General: No focal deficit present.     Mental Status: She is alert and oriented to person, place, and time. Mental status is at baseline.     Cranial Nerves: No cranial nerve deficit.     Motor: No weakness.     Coordination: Coordination normal.     Comments: CN III-XII grossly intact. Grip  strength equal bilaterally. LE equal. Sensation in tact. No focal deficits on exam.    Psychiatric:        Mood and Affect: Mood normal.     ED Results / Procedures / Treatments   Labs (all labs ordered are listed,  but only abnormal results are displayed) Labs Reviewed  COMPREHENSIVE METABOLIC PANEL  CBC  POC OCCULT BLOOD, ED  I-STAT BETA HCG BLOOD, ED ([facility], [facility], [facility] ONLY)  TYPE AND SCREEN  TROPONIN I (HIGH SENSITIVITY)    EKG None  Radiology No results found.  Procedures Procedures   Medications Ordered in ED Medications - No data to display  ED Course  I have reviewed the triage vital signs and the nursing notes.  Pertinent labs & imaging results that were available during my care of the patient were reviewed by me and considered in my medical decision making (see chart for details).  Clinical Course as of 08/14/20 1305  Wed Aug 14, 2020  1218 Hemoglobin(!): 8.1 Improved from 5 days ago at time of discharge [HS]  1218 DG Chest Port 1 View No PNA, PTX [HS]  1219 Comprehensive metabolic panel No electrolyte derangement, no AKI [HS]  1236 POC occult blood, ED [HS]    Clinical Course User Index [HS] Theron Arista, PA-C   MDM Rules/Calculators/A&P                          Patient presents for SOB, headache, and dizziness x 1 day.   Low suspicion for SAH, temporal arteries, meningitis.   Prior visit (08/08/20) - Presented for similar symptoms and was admited for anemia and blood transfusion. CTA at that time showed no evidence of aneurysm or vascular abnormality. Patient hgb was 6.9 and she was hemoccult negative.   Labs and imaging that have returned are reassuring. Her hbg is still low at 8 but improved from 5 days ago at the time of her discharge from the ED. Overall, she is hemodynamically stable without signs of blood loss. Hemoccult negative.   Will give 1 L of fluids. Will walk patient and if she is stable she can be discharged.   Patient ambulatory without  concerns. Vitals stable, sating at 100% without respiratory distress. She has appointments for follow up with a neurologist, OBGYN and her PCP in the next few weeks. Return precautions given.  Discussed HPI, physical exam and plan of care for this patient with attending Rolan Bucco. The attending physician evaluated this patient as part of a shared visit and agrees with plan of care.  Final Clinical Impression(s) / ED Diagnoses Final diagnoses:  None    Rx / DC Orders ED Discharge Orders    None       Theron Arista, PA-C 08/14/20 1342    Theron Arista, PA-C 08/14/20 1435    Rolan Bucco, MD 08/14/20 1516

## 2020-08-15 ENCOUNTER — Ambulatory Visit: Payer: MEDICAID | Admitting: Neurology

## 2020-08-19 NOTE — Progress Notes (Signed)
[facility]  PATIENT: Sara Contreras DOB: [birth year]  REFERRING DOCTOR OR PCP: [facility] SOURCE: Patient, notes from emergency room, imaging and lab reports, CT scan images personally reviewed.  _________________________________   HISTORICAL  CHIEF COMPLAINT:  Chief Complaint  Patient presents with  . New Patient (Initial Visit)    RM 13, alone. Internal referral for headaches, possible post-concussive. Tripped on grape at [facility] May 19, 2020. Has claim out with them. Has hx drug/alcohol abuse. Clean now. No CT.MRI done at ER when she went after falling. Found to have low hemoglobin and had blood transfusion. Headaches intermittent since the fall. Headaches are daily. Wakes up w/ HA. Has a lot of tension in temporal area, pain on top of head and back of neck. Has throbbing pain.     HISTORY OF PRESENT ILLNESS:  I had the pleasure of seeing your patient, Sara Contreras, at [facility] for neurologic evaluation regarding her fall and subsequent headaches and other symptoms.  She is a 35 year old woman reports that she tripped on a grape in a [employer] May 19, 2020 and has had headaches and other symptoms since.   She landed on the left side of her body and hit her head.   There was no loss of consciousness.    She did not have a headache that day or the next day.   However, 3-5 days later she began to experience a headache.   When present, the HA is in her temple region and across the forehead and occiput as a band.  She describes the pain as pressure and throbbing.    She will have some photophobia and phonophobia.  She denies nausea or vomiting.   Her headache started more intermittently but now is daily.   When pain is more intense, she takes Excedrin migraine with short term benefit.   When HA is present, pain seems worse with movement and is a little better with laying down with eyes closed.     She went to  the ED 08/08/20 and was found to have anemia with a Hgb of 6.9 and when repeated 6.0.   She received one unit of blood 08/09/20.  Repeat CBC 08/14/2020 showed a Hgb of 8.1.   She notes vision was midly fuzzy when pain was most intense.   She had some squiggly lines in her vision.  She is sleeping poorly.   Vistaril helps her fall asleep but she wakes up with HA pain sometimes in the middle the night.  She denies other head or neck injuries in the past.  No other major fall in the past.     She does not have h/o HTN, DM.      IMAGING: CT scan of the head and CT angiogram of the head and neck from 08/09/2020: CT scan of the head is normal.  There were no acute findings.  CT angiogram of the head and neck are normal.  The cervical spine was normally aligned and there were no fractures noted.  REVIEW OF SYSTEMS: Constitutional: No fevers, chills, sweats, or change in appetite Eyes: No visual changes, double vision, eye pain Ear, nose and throat: No hearing loss, ear pain, nasal congestion, sore throat Cardiovascular: No chest pain, palpitations Respiratory: No shortness of breath at rest or with exertion.   No wheezes GastrointestinaI: No nausea, vomiting, diarrhea, abdominal pain, fecal incontinence Genitourinary: No dysuria, urinary retention or frequency.  No nocturia. Musculoskeletal: No neck  pain, back pain Integumentary: No rash, pruritus, skin lesions Neurological: as above Psychiatric: No depression at this time.  No anxiety Endocrine: No palpitations, diaphoresis, change in appetite, change in weigh or increased thirst Hematologic/Lymphatic: No anemia, purpura, petechiae. Allergic/Immunologic: No itchy/runny eyes, nasal congestion, recent allergic reactions, rashes  ALLERGIES: Allergies  Allergen Reactions  . Methylene Blue Hives  . Contrast Media [Iodinated Diagnostic Agents] Hives    HOME MEDICATIONS:  Current Outpatient Medications:  .  aspirin-acetaminophen-caffeine  (EXCEDRIN MIGRAINE) 250-250-65 MG tablet, Take by mouth every 6 (six) hours as needed for headache., Disp: , Rfl:  .  ferrous sulfate 325 (65 FE) MG EC tablet, Take 1 tablet (325 mg total) by mouth 2 (two) times daily., Disp: 60 tablet, Rfl: 3 .  hydrOXYzine (VISTARIL) 25 MG capsule, Take 25 mg by mouth every 4 (four) hours as needed for anxiety., Disp: , Rfl:  .  imipramine (TOFRANIL) 25 MG tablet, Take 1 tablet (25 mg total) by mouth at bedtime., Disp: 30 tablet, Rfl: 3 .  meloxicam (MOBIC) 15 MG tablet, Take 1 tablet (15 mg total) by mouth daily., Disp: 30 tablet, Rfl: 3  PAST MEDICAL HISTORY: Past Medical History:  Diagnosis Date  . Abnormal EKG   . Anxiety   . Asthma   . Gastric ulcer   . Migraines   . Morbid obesity (HCC)   . Pancreatitis     PAST SURGICAL HISTORY: Past Surgical History:  Procedure Laterality Date  . LAPAROSCOPIC GASTRIC SLEEVE RESECTION  2016    FAMILY HISTORY: Family History  Problem Relation Age of Onset  . Hypertension Other   . Diabetes Other     SOCIAL HISTORY:  Social History   Socioeconomic History  . Marital status: Single    Spouse name: Not on file  . Number of children: Not on file  . Years of education: Not on file  . Highest education level: Not on file  Occupational History  . Not on file  Tobacco Use  . Smoking status: Current Some Day Smoker    Types: Cigarettes  . Smokeless tobacco: Never Used  . Tobacco comment: rarely smokes  Substance and Sexual Activity  . Alcohol use: Yes    Comment: occassional  . Drug use: No  . Sexual activity: Yes    Comment: Pt is homosexual  Other Topics Concern  . Not on file  Social History Narrative  . Not on file   Social Determinants of Health   Financial Resource Strain: Not on file  Food Insecurity: Not on file  Transportation Needs: Not on file  Physical Activity: Not on file  Stress: Not on file  Social Connections: Not on file  Intimate Partner Violence: Not on file      PHYSICAL EXAM  Vitals:   08/20/20 1016  BP: 112/76  Pulse: 90  Weight: 234 lb 8 oz (106.4 kg)  Height: 5' 7.5" (1.715 m)    Body mass index is 36.19 kg/m.   General: The patient is well-developed and well-nourished and in no acute distress  HEENT:  Head is [place]/AT.  Sclera are anicteric.  Funduscopic exam shows normal optic discs and retinal vessels.  Neck: No carotid bruits are noted.  The neck is mildly tender.  Pressure on occiput causes pain in the forehead.  Lifting the head reduces the pain.  Cardiovascular: The heart has a regular rate and rhythm with a normal S1 and S2. There were no murmurs, gallops or rubs.    Skin: Extremities  are without rash or  edema.  Musculoskeletal:  Back is nontender  Neurologic Exam  Mental status: The patient is alert and oriented x 3 at the time of the examination. The patient has apparent normal recent and remote memory, with an apparently normal attention span and concentration ability.   Speech is normal.  Cranial nerves: Extraocular movements are full. Pupils are equal, round, and reactive to light and accomodation.  Visual fields are full.  Facial symmetry is present. There is good facial sensation to soft touch bilaterally.Facial strength is normal.  Trapezius and sternocleidomastoid strength is normal. No dysarthria is noted.  The tongue is midline, and the patient has symmetric elevation of the soft palate. No obvious hearing deficits are noted.  Motor:  Muscle bulk is normal.   Tone is normal. Strength is  5 / 5 in all 4 extremities.   Sensory: Sensory testing is intact to pinprick, soft touch and vibration sensation in all 4 extremities.  Coordination: Cerebellar testing reveals good finger-nose-finger and heel-to-shin bilaterally.  Gait and station: Station is normal.   Gait is normal. Tandem gait is normal. Romberg is negative.   Reflexes: Deep tendon reflexes are symmetric and normal bilaterally.   Plantar responses are  flexor.    DIAGNOSTIC DATA (LABS, IMAGING, TESTING) - I reviewed patient records, labs, notes, testing and imaging myself where available.  Lab Results  Component Value Date   WBC 4.9 08/14/2020   HGB 8.1 (L) 08/14/2020   HCT 27.9 (L) 08/14/2020   MCV 67.7 (L) 08/14/2020   PLT 277 08/14/2020      Component Value Date/Time   NA 135 08/14/2020 1116   NA 134 (L) 07/25/2012 1828   K 3.9 08/14/2020 1116   K 3.7 07/25/2012 1828   CL 102 08/14/2020 1116   CL 102 07/25/2012 1828   CO2 26 08/14/2020 1116   CO2 28 07/25/2012 1828   GLUCOSE 88 08/14/2020 1116   GLUCOSE 86 07/25/2012 1828   BUN 10 08/14/2020 1116   BUN 14 07/25/2012 1828   CREATININE 0.64 08/14/2020 1116   CREATININE 0.89 07/25/2012 1828   CALCIUM 8.9 08/14/2020 1116   CALCIUM 9.2 07/25/2012 1828   PROT 7.5 08/14/2020 1116   PROT 8.2 07/25/2012 1828   ALBUMIN 3.6 08/14/2020 1116   ALBUMIN 3.6 07/25/2012 1828   AST 20 08/14/2020 1116   AST 27 07/25/2012 1828   ALT 11 08/14/2020 1116   ALT 26 07/25/2012 1828   ALKPHOS 54 08/14/2020 1116   ALKPHOS 72 07/25/2012 1828   BILITOT 1.1 08/14/2020 1116   BILITOT 0.5 07/25/2012 1828   GFRNONAA >60 08/14/2020 1116   GFRNONAA >60 07/25/2012 1828   GFRAA >60 02/20/2017 0218   GFRAA >60 07/25/2012 1828   Lab Results  Component Value Date   CHOL  01/31/2007    115        ATP III CLASSIFICATION:  <200     mg/dL   Desirable  161-096200-239  mg/dL   Borderline High  >=045>=240    mg/dL   High   HDL 51 SLIGHT HEMOLYSIS 01/31/2007   LDLCALC  01/31/2007    54        Total Cholesterol/HDL:CHD Risk Coronary Heart Disease Risk Table                     Men   Women  1/2 Average Risk   3.4   3.3   TRIG 49 SLIGHT HEMOLYSIS 01/31/2007   CHOLHDL 2.3  01/31/2007       ASSESSMENT AND PLAN  Chronic daily headache  Fall, subsequent encounter  Anemia, unspecified type  In summary, Ms. Hefter is a 35 year old woman who has had a chronic daily headache since a few days after a fall.   Additionally, she had severe anemia that required transfusion.   Etiology of the headache is uncertain but since it has persisted we should try a prophylactic medication.  There could be some element of occipital neuralgia.  Instead of intermittent, no migraine, I will have her take meloxicam 15 mg daily.  Additionally, we will start imipramine 25 mg.  This may be able to take the place of the Vistaril and help her sleep.  The dose can be increased depending on her response and tolerability.  If she gets no benefit from these medicines after a few weeks, consider zonisamide as an alternative prophylactic agent or one of the anti-CGRP injections.  She will return in about 3 months.  If her headaches have completely resolved, that appointment can be canceled.  Thank you for asking [place] to see Ms. Essex.  Please let me know can be of further assistance with her or other patients in the future.     Leif Loflin A. Epimenio Foot, MD, [facility] 08/20/2020, 11:22 AM Certified in Neurology, Clinical Neurophysiology, Sleep Medicine and Neuroimaging  [facility] [address] [place], [place] [address] [phone]

## 2020-08-20 ENCOUNTER — Encounter: Payer: Self-pay | Admitting: Neurology

## 2020-08-20 ENCOUNTER — Ambulatory Visit (INDEPENDENT_AMBULATORY_CARE_PROVIDER_SITE_OTHER): Payer: 59 | Admitting: Neurology

## 2020-08-20 VITALS — BP 112/76 | HR 90 | Ht 67.5 in | Wt 234.5 lb

## 2020-08-20 DIAGNOSIS — W19XXXA Unspecified fall, initial encounter: Secondary | ICD-10-CM | POA: Insufficient documentation

## 2020-08-20 DIAGNOSIS — D649 Anemia, unspecified: Secondary | ICD-10-CM | POA: Diagnosis not present

## 2020-08-20 DIAGNOSIS — R519 Headache, unspecified: Secondary | ICD-10-CM | POA: Insufficient documentation

## 2020-08-20 DIAGNOSIS — W19XXXD Unspecified fall, subsequent encounter: Secondary | ICD-10-CM

## 2020-08-20 MED ORDER — MELOXICAM 15 MG PO TABS: 15.0000 mg | ORAL_TABLET | Freq: Every day | ORAL | 3 refills | Status: AC

## 2020-08-20 MED ORDER — IMIPRAMINE HCL 25 MG PO TABS: 25.0000 mg | ORAL_TABLET | Freq: Every day | ORAL | 3 refills | Status: AC

## 2020-11-27 NOTE — Progress Notes (Deleted)
No chief complaint on file.    HISTORY OF PRESENT ILLNESS:  11/27/20 ALL:  Sara Contreras is a 35 y.o. female here today for follow up for headaches. She was started on imipramine 25mg  at bedtime and meloxicam 15mg  daily.    HISTORY (copied from Sater's previous note)  I had the pleasure of seeing your patient, Sara Contreras, at [facility] for neurologic evaluation regarding her fall and subsequent headaches and other symptoms.   She is a 35 year old woman reports that she tripped on a grape in a [facility] May 19, 2020 and has had headaches and other symptoms since.   She landed on the left side of her body and hit her head.   There was no loss of consciousness.    She did not have a headache that day or the next day.   However, 3-5 days later she began to experience a headache.   When present, the HA is in her temple region and across the forehead and occiput as a band.  She describes the pain as pressure and throbbing.    She will have some photophobia and phonophobia.  She denies nausea or vomiting.   Her headache started more intermittently but now is daily.   When pain is more intense, she takes Excedrin migraine with short term benefit.   When HA is present, pain seems worse with movement and is a little better with laying down with eyes closed.      She went to the ED 08/08/20 and was found to have anemia with a Hgb of 6.9 and when repeated 6.0.   She received one unit of blood 08/09/20.  Repeat CBC 08/14/2020 showed a Hgb of 8.1.    She notes vision was midly fuzzy when pain was most intense.   She had some squiggly lines in her vision.   She is sleeping poorly.   Vistaril helps her fall asleep but she wakes up with HA pain sometimes in the middle the night.   She denies other head or neck injuries in the past.  No other major fall in the past.      She does not have h/o HTN, DM.       IMAGING: CT scan of the head and CT angiogram of the head  and neck from 08/09/2020: CT scan of the head is normal.  There were no acute findings.  CT angiogram of the head and neck are normal.  The cervical spine was normally aligned and there were no fractures noted.   REVIEW OF SYSTEMS: Out of a complete 14 system review of symptoms, the patient complains only of the following symptoms, headaches and all other reviewed systems are negative.   ALLERGIES: Allergies  Allergen Reactions   Methylene Blue Hives   Contrast Media [Iodinated Diagnostic Agents] Hives     HOME MEDICATIONS: Outpatient Medications Prior to Visit  Medication Sig Dispense Refill   aspirin-acetaminophen-caffeine (EXCEDRIN MIGRAINE) 250-250-65 MG tablet Take by mouth every 6 (six) hours as needed for headache.     ferrous sulfate 325 (65 FE) MG EC tablet Take 1 tablet (325 mg total) by mouth 2 (two) times daily. 60 tablet 3   hydrOXYzine (VISTARIL) 25 MG capsule Take 25 mg by mouth every 4 (four) hours as needed for anxiety.     imipramine (TOFRANIL) 25 MG tablet Take 1 tablet (25 mg total) by mouth at bedtime. 30 tablet 3   meloxicam (MOBIC) 15 MG tablet  Take 1 tablet (15 mg total) by mouth daily. 30 tablet 3   No facility-administered medications prior to visit.     PAST MEDICAL HISTORY: Past Medical History:  Diagnosis Date   Abnormal EKG    Anxiety    Asthma    Gastric ulcer    Migraines    Morbid obesity (HCC)    Pancreatitis      PAST SURGICAL HISTORY: Past Surgical History:  Procedure Laterality Date   LAPAROSCOPIC GASTRIC SLEEVE RESECTION  2016     FAMILY HISTORY: Family History  Problem Relation Age of Onset   Hypertension Other    Diabetes Other      SOCIAL HISTORY: Social History   Socioeconomic History   Marital status: Single    Spouse name: Not on file   Number of children: Not on file   Years of education: Not on file   Highest education level: Not on file  Occupational History   Not on file  Tobacco Use   Smoking status:  Some Days    Types: Cigarettes   Smokeless tobacco: Never   Tobacco comments:    rarely smokes  Substance and Sexual Activity   Alcohol use: Yes    Comment: occassional   Drug use: No   Sexual activity: Yes    Comment: Pt is homosexual  Other Topics Concern   Not on file  Social History Narrative   Not on file   Social Determinants of Health   Financial Resource Strain: Not on file  Food Insecurity: Not on file  Transportation Needs: Not on file  Physical Activity: Not on file  Stress: Not on file  Social Connections: Not on file  Intimate Partner Violence: Not on file     PHYSICAL EXAM  There were no vitals filed for this visit. There is no height or weight on file to calculate BMI.  Generalized: Well developed, in no acute distress  Cardiology: normal rate and rhythm, no murmur auscultated  Respiratory: clear to auscultation bilaterally    Neurological examination  Mentation: Alert oriented to time, place, history taking. Follows all commands speech and language fluent Cranial nerve II-XII: Pupils were equal round reactive to light. Extraocular movements were full, visual field were full on confrontational test. Facial sensation and strength were normal. Uvula tongue midline. Head turning and shoulder shrug  were normal and symmetric. Motor: The motor testing reveals 5 over 5 strength of all 4 extremities. Good symmetric motor tone is noted throughout.  Sensory: Sensory testing is intact to soft touch on all 4 extremities. No evidence of extinction is noted.  Coordination: Cerebellar testing reveals good finger-nose-finger and heel-to-shin bilaterally.  Gait and station: Gait is normal. Tandem gait is normal. Romberg is negative. No drift is seen.  Reflexes: Deep tendon reflexes are symmetric and normal bilaterally.    DIAGNOSTIC DATA (LABS, IMAGING, TESTING) - I reviewed patient records, labs, notes, testing and imaging myself where available.  Lab Results   Component Value Date   WBC 4.9 08/14/2020   HGB 8.1 (L) 08/14/2020   HCT 27.9 (L) 08/14/2020   MCV 67.7 (L) 08/14/2020   PLT 277 08/14/2020      Component Value Date/Time   NA 135 08/14/2020 1116   NA 134 (L) 07/25/2012 1828   K 3.9 08/14/2020 1116   K 3.7 07/25/2012 1828   CL 102 08/14/2020 1116   CL 102 07/25/2012 1828   CO2 26 08/14/2020 1116   CO2 28 07/25/2012 1828  GLUCOSE 88 08/14/2020 1116   GLUCOSE 86 07/25/2012 1828   BUN 10 08/14/2020 1116   BUN 14 07/25/2012 1828   CREATININE 0.64 08/14/2020 1116   CREATININE 0.89 07/25/2012 1828   CALCIUM 8.9 08/14/2020 1116   CALCIUM 9.2 07/25/2012 1828   PROT 7.5 08/14/2020 1116   PROT 8.2 07/25/2012 1828   ALBUMIN 3.6 08/14/2020 1116   ALBUMIN 3.6 07/25/2012 1828   AST 20 08/14/2020 1116   AST 27 07/25/2012 1828   ALT 11 08/14/2020 1116   ALT 26 07/25/2012 1828   ALKPHOS 54 08/14/2020 1116   ALKPHOS 72 07/25/2012 1828   BILITOT 1.1 08/14/2020 1116   BILITOT 0.5 07/25/2012 1828   GFRNONAA >60 08/14/2020 1116   GFRNONAA >60 07/25/2012 1828   GFRAA >60 02/20/2017 0218   GFRAA >60 07/25/2012 1828   Lab Results  Component Value Date   CHOL  01/31/2007    115        ATP III CLASSIFICATION:  <200     mg/dL   Desirable  937-169  mg/dL   Borderline High  >=678    mg/dL   High   HDL 51 SLIGHT HEMOLYSIS 01/31/2007   LDLCALC  01/31/2007    54        Total Cholesterol/HDL:CHD Risk Coronary Heart Disease Risk Table                     Men   Women  1/2 Average Risk   3.4   3.3   TRIG 49 SLIGHT HEMOLYSIS 01/31/2007   CHOLHDL 2.3 01/31/2007   No results found for: HGBA1C No results found for: VITAMINB12 No results found for: TSH  No flowsheet data found.   No flowsheet data found.   ASSESSMENT AND PLAN  35 y.o. year old female  has a past medical history of Abnormal EKG, Anxiety, Asthma, Gastric ulcer, Migraines, Morbid obesity (HCC), and Pancreatitis. here with    No diagnosis found.   No orders of the  defined types were placed in this encounter.    No orders of the defined types were placed in this encounter.     Shawnie Dapper, MSN, FNP-C 11/27/2020, 2:47 PM  [facility] [address] [place], [place] [address] [phone]

## 2020-11-28 ENCOUNTER — Encounter: Payer: Self-pay | Admitting: Family Medicine

## 2020-11-28 ENCOUNTER — Ambulatory Visit: Payer: 59 | Admitting: Family Medicine

## 2020-11-28 DIAGNOSIS — R519 Headache, unspecified: Secondary | ICD-10-CM

## 2022-01-16 IMAGING — DX DG CHEST 1V PORT
1 series · 1 of 1 positions shown · non-contrast
Comparison: Chest radiographs 01/06/2014 and earlier.

CLINICAL DATA: 34-year-old female with shortness of breath.
Headache and dizziness since last night.

EXAM:
PORTABLE CHEST 1 VIEW

[chest ap]
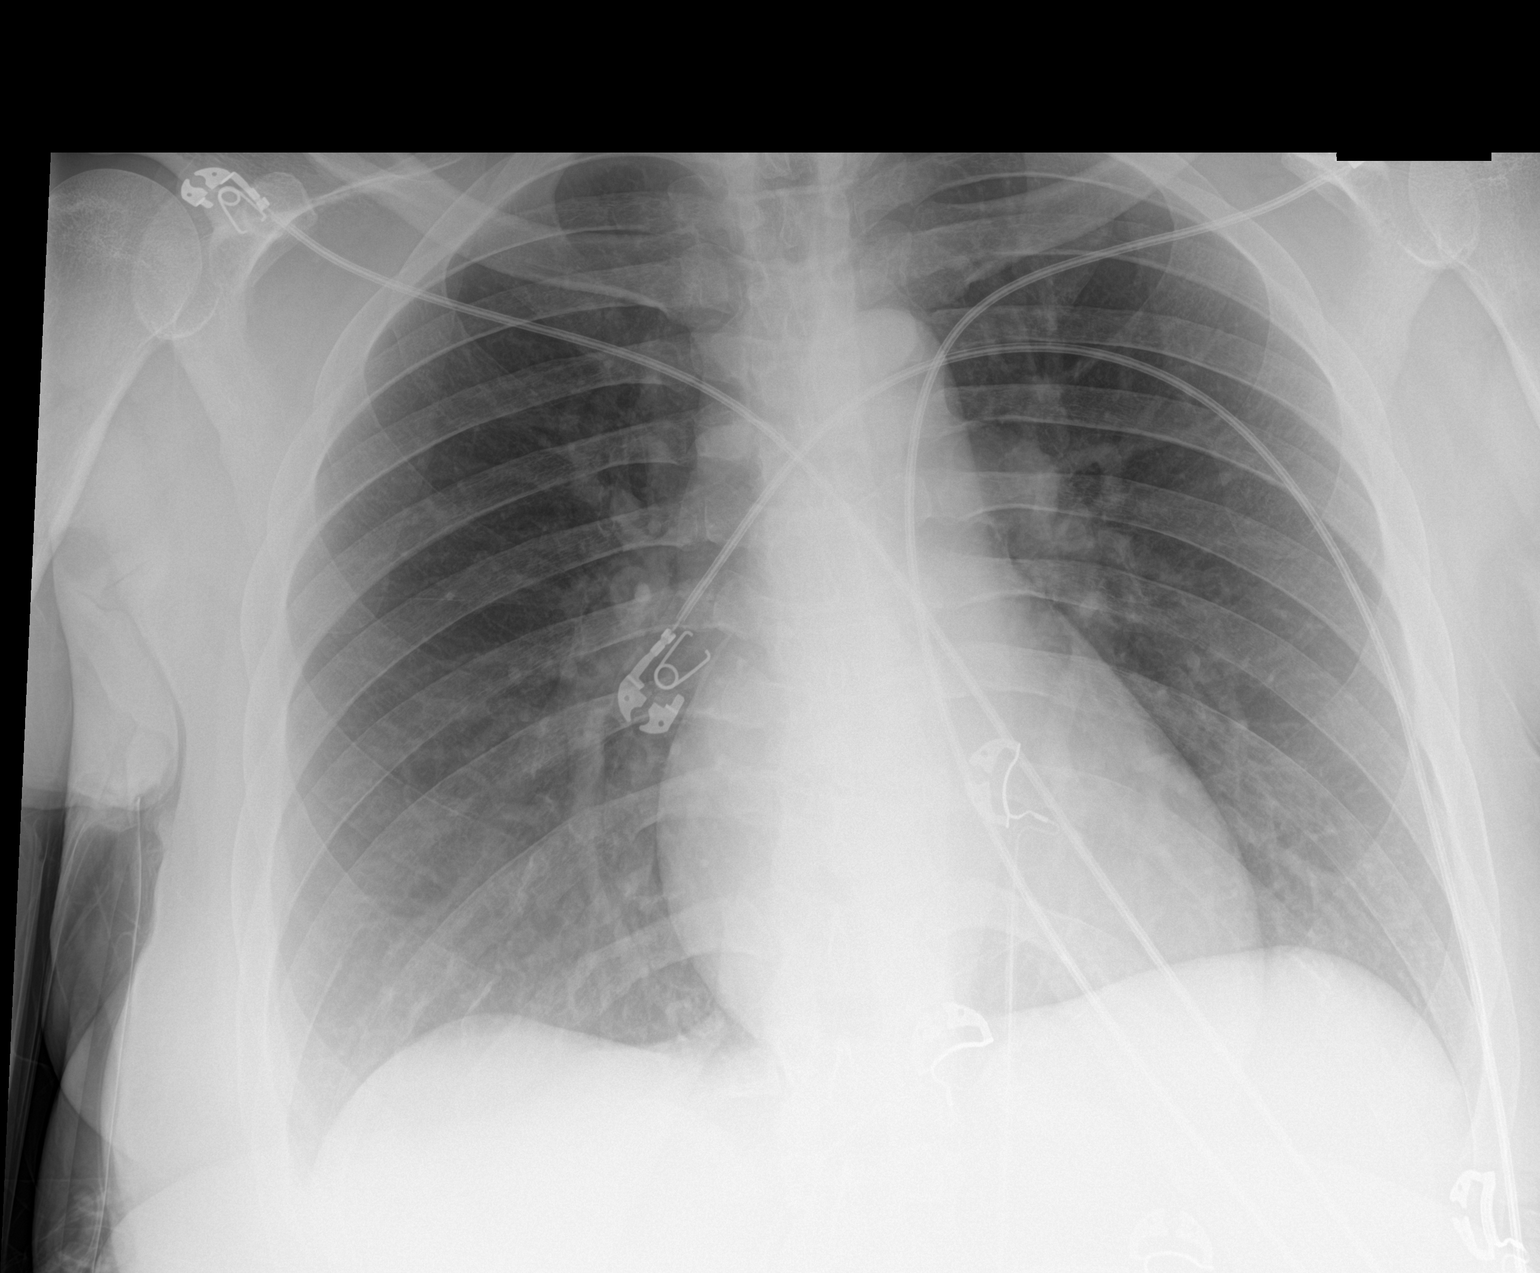

[1 of 1 positions shown; findings below may reference images not displayed]

FINDINGS: Portable AP semi upright view at 7772 hours. Normal lung volumes and
mediastinal contours. Visualized tracheal air column is within
normal limits. Allowing for portable technique the lungs are clear.
No acute osseous abnormality identified. T9 butterfly vertebra
(normal variant) better demonstrated by CT in 4774.
IMPRESSION: Negative portable chest.
